# Patient Record
Sex: Female | Born: 1990 | Race: Black or African American | Hispanic: No | Marital: Single | Smoking: Never smoker
Health system: Southern US, Community
[De-identification: ages and names within clinical notes are randomized; demographics above are authoritative.]

## PROBLEM LIST (undated history)

## (undated) HISTORY — PX: APPENDECTOMY: SHX54

## (undated) HISTORY — PX: WISDOM TOOTH EXTRACTION: SHX21

---

## 2014-09-02 ENCOUNTER — Emergency Department (HOSPITAL_COMMUNITY): Payer: Self-pay

## 2014-09-02 ENCOUNTER — Encounter (HOSPITAL_COMMUNITY): Payer: Self-pay

## 2014-09-02 ENCOUNTER — Emergency Department (HOSPITAL_COMMUNITY)
Admission: EM | Admit: 2014-09-02 | Discharge: 2014-09-02 | Disposition: A | Discharge status: Home / Self Care | Payer: Self-pay | Attending: Emergency Medicine | Admitting: Emergency Medicine

## 2014-09-02 DIAGNOSIS — R102 Pelvic and perineal pain: Secondary | ICD-10-CM

## 2014-09-02 DIAGNOSIS — Z9049 Acquired absence of other specified parts of digestive tract: Secondary | ICD-10-CM | POA: Insufficient documentation

## 2014-09-02 DIAGNOSIS — B9689 Other specified bacterial agents as the cause of diseases classified elsewhere: Secondary | ICD-10-CM

## 2014-09-02 DIAGNOSIS — Z3490 Encounter for supervision of normal pregnancy, unspecified, unspecified trimester: Secondary | ICD-10-CM

## 2014-09-02 DIAGNOSIS — O23591 Infection of other part of genital tract in pregnancy, first trimester: Secondary | ICD-10-CM | POA: Insufficient documentation

## 2014-09-02 DIAGNOSIS — N760 Acute vaginitis: Secondary | ICD-10-CM

## 2014-09-02 DIAGNOSIS — Z3A11 11 weeks gestation of pregnancy: Secondary | ICD-10-CM | POA: Insufficient documentation

## 2014-09-02 DIAGNOSIS — O26899 Other specified pregnancy related conditions, unspecified trimester: Secondary | ICD-10-CM

## 2014-09-02 LAB — WET PREP, GENITAL
TRICH WET PREP: NONE SEEN
YEAST WET PREP: NONE SEEN

## 2014-09-02 LAB — URINALYSIS, ROUTINE W REFLEX MICROSCOPIC
BILIRUBIN URINE: NEGATIVE
Glucose, UA: NEGATIVE mg/dL
Hgb urine dipstick: NEGATIVE
Ketones, ur: NEGATIVE mg/dL
Leukocytes, UA: NEGATIVE
NITRITE: NEGATIVE
Protein, ur: NEGATIVE mg/dL
SPECIFIC GRAVITY, URINE: 1.007 (ref 1.005–1.030)
Urobilinogen, UA: 0.2 mg/dL (ref 0.0–1.0)
pH: 6.5 (ref 5.0–8.0)

## 2014-09-02 LAB — HCG, QUANTITATIVE, PREGNANCY: hCG, Beta Chain, Quant, S: 151032 m[IU]/mL — ABNORMAL HIGH (ref <5)

## 2014-09-02 LAB — POC URINE PREG, ED: Preg Test, Ur: POSITIVE — AB

## 2014-09-02 MED ORDER — PRENATAL COMPLETE 14-0.4 MG PO TABS: 1.0000 | ORAL_TABLET | Freq: Two times a day (BID) | ORAL | Status: DC

## 2014-09-02 MED ORDER — METRONIDAZOLE 500 MG PO TABS: 500.0000 mg | ORAL_TABLET | Freq: Two times a day (BID) | ORAL | Status: DC

## 2014-09-02 NOTE — ED Notes (Signed)
Ultrasound at bedside

## 2014-09-02 NOTE — ED Provider Notes (Signed)
CSN: [id number]     Arrival date & time 09/02/14  1458 History   First MD Initiated Contact with Patient 09/02/14 1516     Chief Complaint  Patient presents with  . Abdominal Pain     (Consider location/radiation/quality/duration/timing/severity/associated sxs/prior Treatment) HPI   PCP: No PCP Per Patient Blood pressure 131/68, pulse 100, temperature 97.9 F (36.6 C), temperature source Oral, resp. rate 16, last menstrual period 07/23/2014, SpO2 100 %.  Hannah Andrews is a 24 y.o.female with a significant PMH of appendectomy presents to the ER with complaints of vaginal discharge, lower abdominal discomfort and urinary frequency for 1 week. She describes the discharged as white. Denies any noticeable odor. She has not had any nausea,vomiting, diarrhea. Denies fevers, back pains, vaginal bleeding. She is sexually active but does not always use protection. She is 2 weeks late on her menstrual cycle. Her pain is suprapubic and described as cramping with sharp intermittent pains.   History reviewed. No pertinent past medical history. Past Surgical History  Procedure Laterality Date  . Appendectomy     History reviewed. No pertinent family history. History  Substance Use Topics  . Smoking status: Never Smoker   . Smokeless tobacco: Never Used  . Alcohol Use: No   OB History    No data available     Review of Systems  10 Systems reviewed and are negative for acute change except as noted in the HPI.    Allergies  Motrin  Home Medications   Prior to Admission medications   Medication Sig Start Date End Date Taking? Authorizing Provider  Chlorpheniramine-Phenylephrine (SINUS & ALLERGY PO) Take 1 tablet by mouth 2 (two) times daily as needed (sinuses.).   Yes Historical Provider, MD  Prenatal Vit-Fe Fumarate-FA (PRENATAL COMPLETE) 14-0.4 MG TABS Take 1 tablet by mouth 2 (two) times daily. 09/02/14   Tashena Ibach Neva Seat, PA-C   BP 97/46 mmHg  Pulse 73  Temp(Src) 98 F (36.7 C)  (Oral)  Resp 18  SpO2 100%  LMP 07/23/2014 Physical Exam  Constitutional: She appears well-developed and well-nourished. No distress.  HENT:  Head: Normocephalic and atraumatic.  Eyes: Pupils are equal, round, and reactive to light.  Neck: Normal range of motion. Neck supple.  Cardiovascular: Normal rate and regular rhythm.   Pulmonary/Chest: Effort normal.  Abdominal: Soft.  Genitourinary: Uterus is enlarged. Cervix exhibits no motion tenderness, no discharge and no friability. Right adnexum displays no mass, no tenderness and no fullness. Left adnexum displays no mass, no tenderness and no fullness.  Cervix is closed  Neurological: She is alert.  Skin: Skin is warm and dry.  Nursing note and vitals reviewed.   ED Course  Procedures (including critical care time) Labs Review Labs Reviewed  WET PREP, GENITAL - Abnormal; Notable for the following:    Clue Cells Wet Prep HPF POC RARE (*)    WBC, Wet Prep HPF POC RARE (*)    All other components within normal limits  HCG, QUANTITATIVE, PREGNANCY - Abnormal; Notable for the following:    hCG, Beta Chain, Quant, Kathie Rhodes [id number] (*)    All other components within normal limits  POC URINE PREG, ED - Abnormal; Notable for the following:    Preg Test, Ur POSITIVE (*)    All other components within normal limits  URINALYSIS, ROUTINE W REFLEX MICROSCOPIC  GC/CHLAMYDIA PROBE AMP ()    Imaging Review US Ob Comp Less 14 Wks  09/02/2014   CLINICAL DATA:  Early pregnancy. Pelvic pain. Estimated  gestational age by last menstrual period equals 7 weeks 2 days.  EXAM: OBSTETRIC <14 WK ULTRASOUND  TECHNIQUE: Transabdominal ultrasound was performed for evaluation of the gestation as well as the maternal uterus and adnexal regions.  COMPARISON:  None.  FINDINGS: Intrauterine gestational sac: Single  Yolk sac:  Present  Embryo:  Present  Cardiac Activity: Present  Heart Rate: 165 bpm  CRL:   45  mm   11 w 2 d                  US EDC: 03/22/2015   Maternal uterus/adnexae: Ovaries not visualized transabdominally. Uterus is normal. No free fluid.  IMPRESSION: 1. Single intrauterine gestation with embryo and normal cardiac activity.  2. Estimated gestational age by crown rump length equals 11 weeks 2 days.   Electronically Signed   By: Genevive BiStewart  Edmunds M.D.   On: 09/02/2014 19:26     EKG Interpretation None      MDM   Final diagnoses:  Pregnant    Patient with 11 week single IUP.  Urinalysis negative for infection. Wet prep- rare clue cells, will treat with flagyl.  I discussed the results with the patient regarding pregnancy and the need for prenatal vitamins and to follow-up with ob/gyn. She has not had any bleeding/spotting. Advised to take Tylenol for back pain and stomach cramps. GC cultures will be sent out.  23 y.o.Hannah Andrews's evaluation in the Emergency Department is complete. It has been determined that no acute conditions requiring further emergency intervention are present at this time. The patient/guardian have been advised of the diagnosis and plan. We have discussed signs and symptoms that warrant return to the ED, such as changes or worsening in symptoms.  Vital signs are stable at discharge. Filed Vitals:   09/02/14 1829  BP: 97/46  Pulse: 73  Temp: 98 F (36.7 C)  Resp: 18    Patient/guardian has voiced understanding and agreed to follow-up with the PCP or specialist.     Marlon Peliffany Dudley Cooley, PA-C 09/02/14 1935  Marlon Peliffany Brinly Maietta, PA-C 09/02/14 1937  Shon Batonourtney F Horton, MD 09/03/14 2005

## 2014-09-02 NOTE — Discharge Instructions (Signed)
First Trimester of Pregnancy °The first trimester of pregnancy is from week 1 until the end of week 12 (months 1 through 3). A week after a sperm fertilizes an egg, the egg will implant on the wall of the uterus. This embryo will begin to develop into a baby. Genes from you and your partner are forming the baby. The female genes determine whether the baby is a boy or a girl. At 6-8 weeks, the eyes and face are formed, and the heartbeat can be seen on ultrasound. At the end of 12 weeks, all the baby's organs are formed.  °Now that you are pregnant, you will want to do everything you can to have a healthy baby. Two of the most important things are to get good prenatal care and to follow your health care provider's instructions. Prenatal care is all the medical care you receive before the baby's birth. This care will help prevent, find, and treat any problems during the pregnancy and childbirth. °BODY CHANGES °Your body goes through many changes during pregnancy. The changes vary from woman to woman.  °· You may gain or lose a couple of pounds at first. °· You may feel sick to your stomach (nauseous) and throw up (vomit). If the vomiting is uncontrollable, call your health care provider. °· You may tire easily. °· You may develop headaches that can be relieved by medicines approved by your health care provider. °· You may urinate more often. Painful urination may mean you have a bladder infection. °· You may develop heartburn as a result of your pregnancy. °· You may develop constipation because certain hormones are causing the muscles that push waste through your intestines to slow down. °· You may develop hemorrhoids or swollen, bulging veins (varicose veins). °· Your breasts may begin to grow larger and become tender. Your nipples may stick out more, and the tissue that surrounds them (areola) may become darker. °· Your gums may bleed and may be sensitive to brushing and flossing. °· Dark spots or blotches (chloasma,  mask of pregnancy) may develop on your face. This will likely fade after the baby is born. °· Your menstrual periods will stop. °· You may have a loss of appetite. °· You may develop cravings for certain kinds of food. °· You may have changes in your emotions from day to day, such as being excited to be pregnant or being concerned that something may go wrong with the pregnancy and baby. °· You may have more vivid and strange dreams. °· You may have changes in your hair. These can include thickening of your hair, rapid growth, and changes in texture. Some women also have hair loss during or after pregnancy, or hair that feels dry or thin. Your hair will most likely return to normal after your baby is born. °WHAT TO EXPECT AT YOUR PRENATAL VISITS °During a routine prenatal visit: °· You will be weighed to make sure you and the baby are growing normally. °· Your blood pressure will be taken. °· Your abdomen will be measured to track your baby's growth. °· The fetal heartbeat will be listened to starting around week 10 or 12 of your pregnancy. °· Test results from any previous visits will be discussed. °Your health care provider may ask you: °· How you are feeling. °· If you are feeling the baby move. °· If you have had any abnormal symptoms, such as leaking fluid, bleeding, severe headaches, or abdominal cramping. °· If you have any questions. °Other tests   that may be performed during your first trimester include: °· Blood tests to find your blood type and to check for the presence of any previous infections. They will also be used to check for low iron levels (anemia) and Rh antibodies. Later in the pregnancy, blood tests for diabetes will be done along with other tests if problems develop. °· Urine tests to check for infections, diabetes, or protein in the urine. °· An ultrasound to confirm the proper growth and development of the baby. °· An amniocentesis to check for possible genetic problems. °· Fetal screens for  spina bifida and Down syndrome. °· You may need other tests to make sure you and the baby are doing well. °HOME CARE INSTRUCTIONS  °Medicines °· Follow your health care provider's instructions regarding medicine use. Specific medicines may be either safe or unsafe to take during pregnancy. °· Take your prenatal vitamins as directed. °· If you develop constipation, try taking a stool softener if your health care provider approves. °Diet °· Eat regular, well-balanced meals. Choose a variety of foods, such as meat or vegetable-based protein, fish, milk and low-fat dairy products, vegetables, fruits, and whole grain breads and cereals. Your health care provider will help you determine the amount of weight gain that is right for you. °· Avoid raw meat and uncooked cheese. These carry germs that can cause birth defects in the baby. °· Eating four or five small meals rather than three large meals a day may help relieve nausea and vomiting. If you start to feel nauseous, eating a few soda crackers can be helpful. Drinking liquids between meals instead of during meals also seems to help nausea and vomiting. °· If you develop constipation, eat more high-fiber foods, such as fresh vegetables or fruit and whole grains. Drink enough fluids to keep your urine clear or pale yellow. °Activity and Exercise °· Exercise only as directed by your health care provider. Exercising will help you: °¨ Control your weight. °¨ Stay in shape. °¨ Be prepared for labor and delivery. °· Experiencing pain or cramping in the lower abdomen or low back is a good sign that you should stop exercising. Check with your health care provider before continuing normal exercises. °· Try to avoid standing for long periods of time. Move your legs often if you must stand in one place for a long time. °· Avoid heavy lifting. °· Wear low-heeled shoes, and practice good posture. °· You may continue to have sex unless your health care provider directs you  otherwise. °Relief of Pain or Discomfort °· Wear a good support bra for breast tenderness.   °· Take warm sitz baths to soothe any pain or discomfort caused by hemorrhoids. Use hemorrhoid cream if your health care provider approves.   °· Rest with your legs elevated if you have leg cramps or low back pain. °· If you develop varicose veins in your legs, wear support hose. Elevate your feet for 15 minutes, 3-4 times a day. Limit salt in your diet. °Prenatal Care °· Schedule your prenatal visits by the twelfth week of pregnancy. They are usually scheduled monthly at first, then more often in the last 2 months before delivery. °· Write down your questions. Take them to your prenatal visits. °· Keep all your prenatal visits as directed by your health care provider. °Safety °· Wear your seat belt at all times when driving. °· Make a list of emergency phone numbers, including numbers for family, friends, the hospital, and police and fire departments. °General Tips °·   Ask your health care provider for a referral to a local prenatal education class. Begin classes no later than at the beginning of month 6 of your pregnancy.  Ask for help if you have counseling or nutritional needs during pregnancy. Your health care provider can offer advice or refer you to specialists for help with various needs.  Do not use hot tubs, steam rooms, or saunas.  Do not douche or use tampons or scented sanitary pads.  Do not cross your legs for long periods of time.  Avoid cat litter boxes and soil used by cats. These carry germs that can cause birth defects in the baby and possibly loss of the fetus by miscarriage or stillbirth.  Avoid all smoking, herbs, alcohol, and medicines not prescribed by your health care provider. Chemicals in these affect the formation and growth of the baby.  Schedule a dentist appointment. At home, brush your teeth with a soft toothbrush and be gentle when you floss. SEEK MEDICAL CARE IF:   You have  dizziness.  You have mild pelvic cramps, pelvic pressure, or nagging pain in the abdominal area.  You have persistent nausea, vomiting, or diarrhea.  You have a bad smelling vaginal discharge.  You have pain with urination.  You notice increased swelling in your face, hands, legs, or ankles. SEEK IMMEDIATE MEDICAL CARE IF:   You have a fever.  You are leaking fluid from your vagina.  You have spotting or bleeding from your vagina.  You have severe abdominal cramping or pain.  You have rapid weight gain or loss.  You vomit blood or material that looks like coffee grounds.  You are exposed to [place] measles and have never had them.  You are exposed to fifth disease or chickenpox.  You develop a severe headache.  You have shortness of breath.  You have any kind of trauma, such as from a fall or a car accident. Document Released: 04/17/2001 Document Revised: 09/07/2013 Document Reviewed: 03/03/2013 [facility] Patient Information 2015 [place], [place]. This information is not intended to replace advice given to you by your health care provider. Make sure you discuss any questions you have with your health care provider.  Prenatal Care  WHAT IS PRENATAL CARE?  Prenatal care means health care during your pregnancy, before your baby is born. It is very important to take care of yourself and your baby during your pregnancy by:   Getting early prenatal care. If you know you are pregnant, or think you might be pregnant, call your health care provider as soon as possible. Schedule a visit for a prenatal exam.  Getting regular prenatal care. Follow your health care provider's schedule for blood and other necessary tests. Do not miss appointments.  Doing everything you can to keep yourself and your baby healthy during your pregnancy.  Getting complete care. Prenatal care should include evaluation of the medical, dietary, educational, psychological, and social needs of you and your  significant other. The medical and genetic history of your family and the family of your baby's father should be discussed with your health care provider.  Discussing with your health care provider:  Prescription, over-the-counter, and herbal medicines that you take.  Any history of substance abuse, alcohol use, smoking, and illegal drug use.  Any history of domestic abuse and violence.  Immunizations you have received.  Your nutrition and diet.  The amount of exercise you do.  Any environmental and occupational hazards to which you are exposed.  History of sexually transmitted  infections for both you and your partner.  Previous pregnancies you have had. WHY IS PRENATAL CARE SO IMPORTANT?  By regularly seeing your health care provider, you help ensure that problems can be identified early so that they can be treated as soon as possible. Other problems might be prevented. Many studies have shown that early and regular prenatal care is important for the health of mothers and their babies.  HOW CAN I TAKE CARE OF MYSELF WHILE I AM PREGNANT?  Here are ways to take care of yourself and your baby:   Start or continue taking your multivitamin with 400 micrograms (mcg) of folic acid every day.  Get early and regular prenatal care. It is very important to see a health care provider during your pregnancy. Your health care provider will check at each visit to make sure that you and your baby are healthy. If there are any problems, action can be taken right away to help you and your baby.  Eat a healthy diet that includes:  Fruits.  Vegetables.  Foods low in saturated fat.  Whole grains.  Calcium-rich foods, such as milk, yogurt, and hard cheeses.  Drink 6-8 glasses of liquids a day.  Unless your health care provider tells you not to, try to be physically active for 30 minutes, most days of the week. If you are pressed for time, you can get your activity in through 10-minute segments,  three times a day.  Do not smoke, drink alcohol, or use drugs. These can cause long-term damage to your baby. Talk with your health care provider about steps to take to stop smoking. Talk with a member of your faith community, a counselor, a trusted friend, or your health care provider if you are concerned about your alcohol or drug use.  Ask your health care provider before taking any medicine, even over-the-counter medicines. Some medicines are not safe to take during pregnancy.  Get plenty of rest and sleep.  Avoid hot tubs and saunas during pregnancy.  Do not have X-rays taken unless absolutely necessary and with the recommendation of your health care provider. A lead shield can be placed on your abdomen to protect your baby when X-rays are taken in other parts of your body.  Do not empty the cat litter when you are pregnant. It may contain a parasite that causes an infection called toxoplasmosis, which can cause birth defects. Also, use gloves when working in garden areas used by cats.  Do not eat uncooked or undercooked meats or fish.  Do not eat soft, mold-ripened cheeses (Brie, Camembert, and chevre) or soft, blue-veined cheese (Danish blue and Roquefort).  Stay away from toxic chemicals like:  Insecticides.  Solvents (some cleaners or paint thinners).  Lead.  Mercury.  Sexual intercourse may continue until the end of the pregnancy, unless you have a medical problem or there is a problem with the pregnancy and your health care provider tells you not to.  Do not wear high-heel shoes, especially during the second half of the pregnancy. You can lose your balance and fall.  Do not take long trips, unless absolutely necessary. Be sure to see your health care provider before going on the trip.  Do not sit in one position for more than 2 hours when on a trip.  Take a copy of your medical records when going on a trip. Know where a hospital is located in the city you are visiting,  in case of an emergency.  Most dangerous household  products will have pregnancy warnings on their labels. Ask your health care provider about products if you are unsure.  Limit or eliminate your caffeine intake from coffee, tea, sodas, medicines, and chocolate.  Many women continue working through pregnancy. Staying active might help you stay healthier. If you have a question about the safety or the hours you work at your particular job, talk with your health care provider.  Get informed:  Read books.  Watch videos.  Go to childbirth classes for you and your significant other.  Talk with experienced moms.  Ask your health care provider about childbirth education classes for you and your partner. Classes can help you and your partner prepare for the birth of your baby.  Ask about a baby doctor (pediatrician) and methods and pain medicine for labor, delivery, and possible cesarean delivery. HOW OFTEN SHOULD I SEE MY HEALTH CARE PROVIDER DURING PREGNANCY?  Your health care provider will give you a schedule for your prenatal visits. You will have visits more often as you get closer to the end of your pregnancy. An average pregnancy lasts about 40 weeks.  A typical schedule includes visiting your health care provider:   About once each month during your first 6 months of pregnancy.  Every 2 weeks during the next 2 months.  Weekly in the last month, until the delivery date. Your health care provider will probably want to see you more often if:  You are older than 35 years.  Your pregnancy is high risk because you have certain health problems or problems with the pregnancy, such as:  Diabetes.  High blood pressure.  The baby is not growing on schedule, according to the dates of the pregnancy. Your health care provider will do special tests to make sure you and your baby are not having any serious problems. WHAT HAPPENS DURING PRENATAL VISITS?   At your first prenatal visit, your  health care provider will do a physical exam and talk to you about your health history and the health history of your partner and your family. Your health care provider will be able to tell you what date to expect your baby to be born on.  Your first physical exam will include checks of your blood pressure, measurements of your height and weight, and an exam of your pelvic organs. Your health care provider will do a Pap test if you have not had one recently and will do cultures of your cervix to make sure there is no infection.  At each prenatal visit, there will be tests of your blood, urine, blood pressure, weight, and the progress of the baby will be checked.  At your later prenatal visits, your health care provider will check how you are doing and how your baby is developing. You may have a number of tests done as your pregnancy progresses.  Ultrasound exams are often used to check on your baby's growth and health.  You may have more urine and blood tests, as well as special tests, if needed. These may include amniocentesis to examine fluid in the pregnancy sac, stress tests to check how the baby responds to contractions, or a biophysical profile to measure your baby's well-being. Your health care provider will explain the tests and why they are necessary.  You should be tested for high blood sugar (gestational diabetes) between the 24th and 28th weeks of your pregnancy.  You should discuss with your health care provider your plans to breastfeed or bottle-feed your baby.  Each  visit is also a chance for you to learn about staying healthy during pregnancy and to ask questions. Document Released: 04/26/2003 Document Revised: 04/28/2013 Document Reviewed: 07/08/2013 [facility] Patient Information 2015 [place], [place]. This information is not intended to replace advice given to you by your health care provider. Make sure you discuss any questions you have with your health care provider.

## 2014-09-02 NOTE — ED Notes (Signed)
Patient c/o lower abdominal pain x 1 week. Patient states she has been having a small amount of white vaginal discharge. Patient also reports urinary frequency.

## 2014-09-03 LAB — GC/CHLAMYDIA PROBE AMP (~~LOC~~) NOT AT ARMC
CHLAMYDIA, DNA PROBE: NEGATIVE
Neisseria Gonorrhea: NEGATIVE

## 2016-11-28 ENCOUNTER — Encounter: Payer: Self-pay | Admitting: Physician Assistant

## 2016-11-28 ENCOUNTER — Ambulatory Visit

## 2016-11-28 ENCOUNTER — Ambulatory Visit (INDEPENDENT_AMBULATORY_CARE_PROVIDER_SITE_OTHER): Payer: Self-pay | Admitting: Physician Assistant

## 2016-11-28 VITALS — BP 100/57 | HR 77 | Temp 98.4°F | Resp 16 | Ht 61.25 in | Wt 157.8 lb

## 2016-11-28 DIAGNOSIS — S90111A Contusion of right great toe without damage to nail, initial encounter: Secondary | ICD-10-CM

## 2016-11-28 DIAGNOSIS — M79674 Pain in right toe(s): Secondary | ICD-10-CM

## 2016-11-28 NOTE — Progress Notes (Signed)
[facility] [address], [place] [phone]  Date:  11/28/2016   Name:  Hannah Andrews   DOB:  08/03/1990   MRN:  [id number]  PCP:  Patient, No Pcp Per    History of Present Illness:  Hannah Andrews is a 26 y.o. female patient who presents to PCP with  Chief Complaint  Patient presents with  . Foot Injury    RIGHT - WORKER'S COMP 11/28/2016     Patient notes right toe pain that started today after a drawer like box slipped out and fell on her toe.  She had no bleeding but has had significant pain at her left toe. She has not done anything therapeutic for the foot.  She denies numbness or tingling.   There are no active problems to display for this patient.   No past medical history on file.  Past Surgical History:  Procedure Laterality Date  . APPENDECTOMY      Social History  Substance Use Topics  . Smoking status: Never Smoker  . Smokeless tobacco: Never Used  . Alcohol use No    No family history on file.  Allergies  Allergen Reactions  . Motrin [Ibuprofen] Swelling    Liquid--make eyes swell.      Medication list has been reviewed and updated.  Current Outpatient Prescriptions on File Prior to Visit  Medication Sig Dispense Refill  . metroNIDAZOLE (FLAGYL) 500 MG tablet Take 1 tablet (500 mg total) by mouth 2 (two) times daily. 14 tablet 0  . Prenatal Vit-Fe Fumarate-FA (PRENATAL COMPLETE) 14-0.4 MG TABS Take 1 tablet by mouth 2 (two) times daily. (Patient not taking: Reported on 11/28/2016) 60 each 2   No current facility-administered medications on file prior to visit.     ROS ROS otherwise unremarkable unless listed above.  Physical Examination: BP (!) 100/57 (BP Location: Right Arm, Patient Position: Sitting, Cuff Size: Normal)   Pulse 77   Temp 98.4 F (36.9 C) (Oral)   Resp 16   Ht 5' 1.25" (1.556 m)   Wt 157 lb 12.8 oz (71.6 kg)   LMP 11/08/2016   SpO2 100%   BMI 29.57 kg/m  Ideal Body Weight: Weight in  (lb) to have BMI = 25: 133.1  Physical Exam  Constitutional: She is oriented to person, place, and time. She appears well-developed and well-nourished. No distress.  HENT:  Head: Normocephalic and atraumatic.  Right Ear: External ear normal.  Left Ear: External ear normal.  Eyes: Pupils are equal, round, and reactive to light. Conjunctivae and EOM are normal.  Cardiovascular: Normal rate.   Pulmonary/Chest: Effort normal. No respiratory distress.  Musculoskeletal:  Very slight erythema at the dip of the 1st right toe.  Tenderness at this location.  Normal ROM.  Neurological: She is alert and oriented to person, place, and time.  Skin: She is not diaphoretic.  Psychiatric: She has a normal mood and affect. Her behavior is normal.     Assessment and Plan: Hannah Andrews is a 26 y.o. female who is here today for cc of right toe pain. Advised ice and tylenol. Likely contusion. Contusion of right great toe without damage to nail, initial encounter  Pain of right great toe - Plan: DG Toe Great Right  Trena PlattStephanie English, PA-C [facility] 8/2/20186:52 AM

## 2016-11-28 NOTE — Patient Instructions (Addendum)
  Please ice the toe three times per day for 15 minutes.   You can take tylenol for the pain.    Contusion A contusion is a deep bruise. Contusions happen when an injury causes bleeding under the skin. Symptoms of bruising include pain, swelling, and discolored skin. The skin may turn blue, purple, or yellow. Follow these instructions at home:  Rest the injured area.  If told, put ice on the injured area. ? Put ice in a plastic bag. ? Place a towel between your skin and the bag. ? Leave the ice on for 20 minutes, 2-3 times per day.  If told, put light pressure (compression) on the injured area using an elastic bandage. Make sure the bandage is not too tight. Remove it and put it back on as told by your doctor.  If possible, raise (elevate) the injured area above the level of your heart while you are sitting or lying down.  Take over-the-counter and prescription medicines only as told by your doctor. Contact a doctor if:  Your symptoms do not get better after several days of treatment.  Your symptoms get worse.  You have trouble moving the injured area. Get help right away if:  You have very bad pain.  You have a loss of feeling (numbness) in a hand or foot.  Your hand or foot turns pale or cold. This information is not intended to replace advice given to you by your health care provider. Make sure you discuss any questions you have with your health care provider. Document Released: 10/10/2007 Document Revised: 09/29/2015 Document Reviewed: 09/08/2014 [employer] Interactive Patient Education  2018 [employer]    IF you received an x-ray today, you will receive an invoice from [facility]. Please contact [facility] at [phone] with questions or concerns regarding your invoice.   IF you received labwork today, you will receive an invoice from [facility]. Please contact [facility] at [phone] with questions or concerns regarding your invoice.   Our  billing staff will not be able to assist you with questions regarding bills from these companies.  You will be contacted with the lab results as soon as they are available. The fastest way to get your results is to activate your My Chart account. Instructions are located on the last page of this paperwork. If you have not heard from us regarding the results in 2 weeks, please contact this office.

## 2017-08-07 ENCOUNTER — Encounter: Payer: Self-pay | Admitting: Physician Assistant

## 2017-10-16 ENCOUNTER — Ambulatory Visit (INDEPENDENT_AMBULATORY_CARE_PROVIDER_SITE_OTHER): Payer: Federal, State, Local not specified - PPO | Admitting: Family Medicine

## 2017-10-16 ENCOUNTER — Encounter: Payer: Self-pay | Admitting: Family Medicine

## 2017-10-16 ENCOUNTER — Other Ambulatory Visit: Payer: Self-pay

## 2017-10-16 VITALS — BP 106/68 | HR 58 | Temp 98.8°F | Resp 18 | Ht 62.25 in | Wt 164.2 lb

## 2017-10-16 DIAGNOSIS — Z124 Encounter for screening for malignant neoplasm of cervix: Secondary | ICD-10-CM

## 2017-10-16 DIAGNOSIS — R140 Abdominal distension (gaseous): Secondary | ICD-10-CM | POA: Diagnosis not present

## 2017-10-16 DIAGNOSIS — Z0000 Encounter for general adult medical examination without abnormal findings: Secondary | ICD-10-CM

## 2017-10-16 DIAGNOSIS — Z113 Encounter for screening for infections with a predominantly sexual mode of transmission: Secondary | ICD-10-CM

## 2017-10-16 DIAGNOSIS — R87610 Atypical squamous cells of undetermined significance on cytologic smear of cervix (ASC-US): Secondary | ICD-10-CM | POA: Diagnosis not present

## 2017-10-16 NOTE — Progress Notes (Signed)
Chief Complaint  Patient presents with  . Annual Exam    with pap    Subjective:  Hannah Andrews is a 27 y.o. female here for a health maintenance visit.  Patient is established pt  There are no active problems to display for this patient.   No past medical history on file.  Past Surgical History:  Procedure Laterality Date  . APPENDECTOMY       Outpatient Medications Prior to Visit  Medication Sig Dispense Refill  . metroNIDAZOLE (FLAGYL) 500 MG tablet Take 1 tablet (500 mg total) by mouth 2 (two) times daily. (Patient not taking: Reported on 10/16/2017) 14 tablet 0  . Prenatal Vit-Fe Fumarate-FA (PRENATAL COMPLETE) 14-0.4 MG TABS Take 1 tablet by mouth 2 (two) times daily. (Patient not taking: Reported on 11/28/2016) 60 each 2   No facility-administered medications prior to visit.     Allergies  Allergen Reactions  . Motrin [Ibuprofen] Swelling    Liquid--make eyes swell.       Family History  Problem Relation Age of Onset  . Diabetes Maternal Grandmother      Health Habits: Dental Exam: up to date Eye Exam: up to date Exercise: 5 times/week on average Current exercise activities: walking/running Diet: balanced  Social History   Socioeconomic History  . Marital status: Single    Spouse name: Not on file  . Number of children: Not on file  . Years of education: Not on file  . Highest education level: Not on file  Occupational History  . Not on file  Social Needs  . Financial resource strain: Not on file  . Food insecurity:    Worry: Not on file    Inability: Not on file  . Transportation needs:    Medical: Not on file    Non-medical: Not on file  Tobacco Use  . Smoking status: Never Smoker  . Smokeless tobacco: Never Used  Substance and Sexual Activity  . Alcohol use: No  . Drug use: No  . Sexual activity: Yes    Birth control/protection: Condom  Lifestyle  . Physical activity:    Days per week: Not on file    Minutes per session: Not on  file  . Stress: Not on file  Relationships  . Social connections:    Talks on phone: Not on file    Gets together: Not on file    Attends religious service: Not on file    Active member of club or organization: Not on file    Attends meetings of clubs or organizations: Not on file    Relationship status: Not on file  . Intimate partner violence:    Fear of current or ex partner: Not on file    Emotionally abused: Not on file    Physically abused: Not on file    Forced sexual activity: Not on file  Other Topics Concern  . Not on file  Social History Narrative  . Not on file   Social History   Substance and Sexual Activity  Alcohol Use No   Social History   Tobacco Use  Smoking Status Never Smoker  Smokeless Tobacco Never Used   Social History   Substance and Sexual Activity  Drug Use No    GYN: Sexual Health Menstrual status: regular menses LMP: Patient's last menstrual period was 10/14/2017. Last pap smear: see HM section History of abnormal pap smears:    Health Maintenance: See under health Maintenance activity for review of completion dates as well.  There is no immunization history on file for this patient.    Depression Screen-PHQ2/9 Depression screen [facility] 2/9 10/16/2017 11/28/2016  Decreased Interest 0 0  Down, Depressed, Hopeless 0 0  PHQ - 2 Score 0 0       Depression Severity and Treatment Recommendations:  0-4= None  5-9= Mild / Treatment: Support, educate to call if worse; return in one month  10-14= Moderate / Treatment: Support, watchful waiting; Antidepressant or Psycotherapy  15-19= Moderately severe / Treatment: Antidepressant OR Psychotherapy  >= 20 = Major depression, severe / Antidepressant AND Psychotherapy    Review of Systems   Review of Systems  Constitutional: Negative for chills, fever and weight loss.  HENT: Negative for ear pain, hearing loss and tinnitus.   Eyes: Negative for blurred vision and double vision.   Respiratory: Negative for cough, shortness of breath and wheezing.   Cardiovascular: Negative for chest pain and palpitations.  Gastrointestinal: Negative for abdominal pain, nausea and vomiting.  Genitourinary: Negative for dysuria, frequency, hematuria and urgency.  Musculoskeletal: Negative for back pain, myalgias and neck pain.  Skin: Negative for itching and rash.  Neurological: Negative for dizziness, tingling and headaches.  Psychiatric/Behavioral: Negative for depression. The patient is not nervous/anxious and does not have insomnia.     See HPI for ROS as well.    Objective:   Vitals:   10/16/17 1412  BP: 106/68  Pulse: (!) 58  Resp: 18  Temp: 98.8 F (37.1 C)  TempSrc: Oral  SpO2: 99%  Weight: 164 lb 3.2 oz (74.5 kg)  Height: 5' 2.25" (1.581 m)    Body mass index is 29.79 kg/m.  Physical Exam  BP 106/68 (BP Location: Left Arm, Patient Position: Sitting, Cuff Size: Normal)   Pulse (!) 58   Temp 98.8 F (37.1 C) (Oral)   Resp 18   Ht 5' 2.25" (1.581 m)   Wt 164 lb 3.2 oz (74.5 kg)   LMP 10/14/2017   SpO2 99%   BMI 29.79 kg/m   General Appearance:    Alert, cooperative, no distress, appears stated age  Head:    Normocephalic, without obvious abnormality, atraumatic  Eyes:    PERRL, conjunctiva/corneas clear, EOM's intact, fundi    benign, both eyes  Ears:    Normal TM's and external ear canals, both ears  Nose:   Nares normal, septum midline, mucosa normal, no drainage    or sinus tenderness  Throat:   Lips, mucosa, and tongue normal; teeth and gums normal  Neck:   Supple, symmetrical, trachea midline, no adenopathy;    thyroid:  no enlargement/tenderness/nodules; no carotid   bruit or JVD  Back:     Symmetric, no curvature, ROM normal, no CVA tenderness  Lungs:     Clear to auscultation bilaterally, respirations unlabored  Chest Wall:    No tenderness or deformity   Heart:    Regular rate and rhythm, S1 and S2 normal, no murmur, rub   or gallop   Breast Exam:    No tenderness, masses, or nipple abnormality  Abdomen:     Soft, non-tender, bowel sounds active all four quadrants,    no masses, no organomegaly  Genitalia:    Vaginal exam- Chaperone Present Labia normal bilaterally without skin lesions Urethral meatus normal appearing without erythema Vagina without discharge No CMT, ovaries small and not palpable Uterus midline, non-tender Pap smear performed   Extremities:   Extremities normal, atraumatic, no cyanosis or edema  Pulses:   2+ and  symmetric all extremities  Skin:   Skin color, texture, turgor normal, no rashes or lesions  Lymph nodes:   Cervical, supraclavicular, and axillary nodes normal  Neurologic:   CNII-XII intact, normal strength, sensation and reflexes    throughout       Assessment/Plan:   Patient was seen for a health maintenance exam.  Counseled the patient on health maintenance issues. Reviewed her health mainteance schedule and ordered appropriate tests (see orders.) Counseled on regular exercise and weight management. Recommend regular eye exams and dental cleaning.   The following issues were addressed today for health maintenance:   Avi was seen today for annual exam.  Diagnoses and all orders for this visit:  Encounter for health maintenance examination in adult Women's Health Maintenance Plan Advised monthly breast exam  Advised dental exam every six months Discussed stress management Discussed pap smear screening guidelines    Screen for STD (sexually transmitted disease)- verbal consent given for std screening -     HIV antibody -     Hepatitis B surface antigen -     RPR  Encounter for Papanicolaou smear for cervical cancer screening -     Pap IG, CT/NG w/ reflex HPV when ASC-U   Discussed cervical cancer screening  As well as screening for HPV Discussed risk factors for cervical cancer  And hpv vaccination records reviewed   Bloating Discuss probiotic and  increased hydration   No follow-ups on file.    Body mass index is 29.79 kg/m.:  Discussed the patient's BMI with patient. The BMI body mass index is 29.79 kg/m.     No future appointments.  Patient Instructions   Bloating Take a probiotic such as align or culturelle  Then for constipation use metamucil  Green tea for caffeine and help with water retention     IF you received an x-ray today, you will receive an invoice from [facility]. Please contact [facility] at [phone] with questions or concerns regarding your invoice.   IF you received labwork today, you will receive an invoice from [facility]. Please contact [facility] at [phone] with questions or concerns regarding your invoice.   Our billing staff will not be able to assist you with questions regarding bills from these companies.  You will be contacted with the lab results as soon as they are available. The fastest way to get your results is to activate your My Chart account. Instructions are located on the last page of this paperwork. If you have not heard from [place] regarding the results in 2 weeks, please contact this office.    Health Maintenance, Female Adopting a healthy lifestyle and getting preventive care can go a long way to promote health and wellness. Talk with your health care provider about what schedule of regular examinations is right for you. This is a good chance for you to check in with your provider about disease prevention and staying healthy. In between checkups, there are plenty of things you can do on your own. Experts have done a lot of research about which lifestyle changes and preventive measures are most likely to keep you healthy. Ask your health care provider for more information. Weight and diet Eat a healthy diet  Be sure to include plenty of vegetables, fruits, low-fat dairy products, and lean protein.  Do not eat a lot of foods high in solid fats, added sugars,  or salt.  Get regular exercise. This is one of the most important things you can do for  your health. ? Most adults should exercise for at least 150 minutes each week. The exercise should increase your heart rate and make you sweat (moderate-intensity exercise). ? Most adults should also do strengthening exercises at least twice a week. This is in addition to the moderate-intensity exercise.  Maintain a healthy weight  Body mass index (BMI) is a measurement that can be used to identify possible weight problems. It estimates body fat based on height and weight. Your health care provider can help determine your BMI and help you achieve or maintain a healthy weight.  For females 39 years of age and older: ? A BMI below 18.5 is considered underweight. ? A BMI of 18.5 to 24.9 is normal. ? A BMI of 25 to 29.9 is considered overweight. ? A BMI of 30 and above is considered obese.  Watch levels of cholesterol and blood lipids  You should start having your blood tested for lipids and cholesterol at 26 years of age, then have this test every 5 years.  You may need to have your cholesterol levels checked more often if: ? Your lipid or cholesterol levels are high. ? You are older than 26 years of age. ? You are at high risk for heart disease.  Cancer screening Lung Cancer  Lung cancer screening is recommended for adults 69-29 years old who are at high risk for lung cancer because of a history of smoking.  A yearly low-dose CT scan of the lungs is recommended for people who: ? Currently smoke. ? Have quit within the past 15 years. ? Have at least a 30-pack-year history of smoking. A pack year is smoking an average of one pack of cigarettes a day for 1 year.  Yearly screening should continue until it has been 15 years since you quit.  Yearly screening should stop if you develop a health problem that would prevent you from having lung cancer treatment.  Breast Cancer  Practice breast  self-awareness. This means understanding how your breasts normally appear and feel.  It also means doing regular breast self-exams. Let your health care provider know about any changes, no matter how small.  If you are in your 20s or 30s, you should have a clinical breast exam (CBE) by a health care provider every 1-3 years as part of a regular health exam.  If you are 67 or older, have a CBE every year. Also consider having a breast X-ray (mammogram) every year.  If you have a family history of breast cancer, talk to your health care provider about genetic screening.  If you are at high risk for breast cancer, talk to your health care provider about having an MRI and a mammogram every year.  Breast cancer gene (BRCA) assessment is recommended for women who have family members with BRCA-related cancers. BRCA-related cancers include: ? Breast. ? Ovarian. ? Tubal. ? Peritoneal cancers.  Results of the assessment will determine the need for genetic counseling and BRCA1 and BRCA2 testing.  Cervical Cancer Your health care provider may recommend that you be screened regularly for cancer of the pelvic organs (ovaries, uterus, and vagina). This screening involves a pelvic examination, including checking for microscopic changes to the surface of your cervix (Pap test). You may be encouraged to have this screening done every 3 years, beginning at age 65.  For women ages 59-65, health care providers may recommend pelvic exams and Pap testing every 3 years, or they may recommend the Pap and pelvic exam, combined with testing  for human papilloma virus (HPV), every 5 years. Some types of HPV increase your risk of cervical cancer. Testing for HPV may also be done on women of any age with unclear Pap test results.  Other health care providers may not recommend any screening for nonpregnant women who are considered low risk for pelvic cancer and who do not have symptoms. Ask your health care provider if a  screening pelvic exam is right for you.  If you have had past treatment for cervical cancer or a condition that could lead to cancer, you need Pap tests and screening for cancer for at least 20 years after your treatment. If Pap tests have been discontinued, your risk factors (such as having a new sexual partner) need to be reassessed to determine if screening should resume. Some women have medical problems that increase the chance of getting cervical cancer. In these cases, your health care provider may recommend more frequent screening and Pap tests.  Colorectal Cancer  This type of cancer can be detected and often prevented.  Routine colorectal cancer screening usually begins at 26 years of age and continues through 26 years of age.  Your health care provider may recommend screening at an earlier age if you have risk factors for colon cancer.  Your health care provider may also recommend using home test kits to check for hidden blood in the stool.  A small camera at the end of a tube can be used to examine your colon directly (sigmoidoscopy or colonoscopy). This is done to check for the earliest forms of colorectal cancer.  Routine screening usually begins at age 2.  Direct examination of the colon should be repeated every 5-10 years through 26 years of age. However, you may need to be screened more often if early forms of precancerous polyps or small growths are found.  Skin Cancer  Check your skin from head to toe regularly.  Tell your health care provider about any new moles or changes in moles, especially if there is a change in a mole's shape or color.  Also tell your health care provider if you have a mole that is larger than the size of a pencil eraser.  Always use sunscreen. Apply sunscreen liberally and repeatedly throughout the day.  Protect yourself by wearing long sleeves, pants, a wide-brimmed hat, and sunglasses whenever you are outside.  Heart disease, diabetes, and  high blood pressure  High blood pressure causes heart disease and increases the risk of stroke. High blood pressure is more likely to develop in: ? People who have blood pressure in the high end of the normal range (130-139/85-89 mm Hg). ? People who are overweight or obese. ? People who are African American.  If you are 83-22 years of age, have your blood pressure checked every 3-5 years. If you are 35 years of age or older, have your blood pressure checked every year. You should have your blood pressure measured twice-once when you are at a hospital or clinic, and once when you are not at a hospital or clinic. Record the average of the two measurements. To check your blood pressure when you are not at a hospital or clinic, you can use: ? An automated blood pressure machine at a pharmacy. ? A home blood pressure monitor.  If you are between 63 years and 1 years old, ask your health care provider if you should take aspirin to prevent strokes.  Have regular diabetes screenings. This involves taking a blood sample to  check your fasting blood sugar level. ? If you are at a normal weight and have a low risk for diabetes, have this test once every three years after 26 years of age. ? If you are overweight and have a high risk for diabetes, consider being tested at a younger age or more often. Preventing infection Hepatitis B  If you have a higher risk for hepatitis B, you should be screened for this virus. You are considered at high risk for hepatitis B if: ? You were born in a country where hepatitis B is common. Ask your health care provider which countries are considered high risk. ? Your parents were born in a high-risk country, and you have not been immunized against hepatitis B (hepatitis B vaccine). ? You have HIV or AIDS. ? You use needles to inject street drugs. ? You live with someone who has hepatitis B. ? You have had sex with someone who has hepatitis B. ? You get hemodialysis  treatment. ? You take certain medicines for conditions, including cancer, organ transplantation, and autoimmune conditions.  Hepatitis C  Blood testing is recommended for: ? Everyone born from [birth year] through 1964. ? Anyone with known risk factors for hepatitis C.  Sexually transmitted infections (STIs)  You should be screened for sexually transmitted infections (STIs) including gonorrhea and chlamydia if: ? You are sexually active and are younger than 26 years of age. ? You are older than 26 years of age and your health care provider tells you that you are at risk for this type of infection. ? Your sexual activity has changed since you were last screened and you are at an increased risk for chlamydia or gonorrhea. Ask your health care provider if you are at risk.  If you do not have HIV, but are at risk, it may be recommended that you take a prescription medicine daily to prevent HIV infection. This is called pre-exposure prophylaxis (PrEP). You are considered at risk if: ? You are sexually active and do not regularly use condoms or know the HIV status of your partner(s). ? You take drugs by injection. ? You are sexually active with a partner who has HIV.  Talk with your health care provider about whether you are at high risk of being infected with HIV. If you choose to begin PrEP, you should first be tested for HIV. You should then be tested every 3 months for as long as you are taking PrEP. Pregnancy  If you are premenopausal and you may become pregnant, ask your health care provider about preconception counseling.  If you may become pregnant, take 400 to 800 micrograms (mcg) of folic acid every day.  If you want to prevent pregnancy, talk to your health care provider about birth control (contraception). Osteoporosis and menopause  Osteoporosis is a disease in which the bones lose minerals and strength with aging. This can result in serious bone fractures. Your risk for osteoporosis  can be identified using a bone density scan.  If you are 47 years of age or older, or if you are at risk for osteoporosis and fractures, ask your health care provider if you should be screened.  Ask your health care provider whether you should take a calcium or vitamin D supplement to lower your risk for osteoporosis.  Menopause may have certain physical symptoms and risks.  Hormone replacement therapy may reduce some of these symptoms and risks. Talk to your health care provider about whether hormone replacement therapy is right for  you. Follow these instructions at home:  Schedule regular health, dental, and eye exams.  Stay current with your immunizations.  Do not use any tobacco products including cigarettes, chewing tobacco, or electronic cigarettes.  If you are pregnant, do not drink alcohol.  If you are breastfeeding, limit how much and how often you drink alcohol.  Limit alcohol intake to no more than 1 drink per day for nonpregnant women. One drink equals 12 ounces of beer, 5 ounces of wine, or 1 ounces of hard liquor.  Do not use street drugs.  Do not share needles.  Ask your health care provider for help if you need support or information about quitting drugs.  Tell your health care provider if you often feel depressed.  Tell your health care provider if you have ever been abused or do not feel safe at home. This information is not intended to replace advice given to you by your health care provider. Make sure you discuss any questions you have with your health care provider. Document Released: 11/06/2010 Document Revised: 09/29/2015 Document Reviewed: 01/25/2015 Elsevier Interactive Patient Education  [employer].

## 2017-10-16 NOTE — Patient Instructions (Addendum)
Bloating Take a probiotic such as align or culturelle  Then for constipation use metamucil  Green tea for caffeine and help with water retention     IF you received an x-ray today, you will receive an invoice from [facility]. Please contact [facility] at [phone] with questions or concerns regarding your invoice.   IF you received labwork today, you will receive an invoice from [facility]. Please contact [facility] at [phone] with questions or concerns regarding your invoice.   Our billing staff will not be able to assist you with questions regarding bills from these companies.  You will be contacted with the lab results as soon as they are available. The fastest way to get your results is to activate your My Chart account. Instructions are located on the last page of this paperwork. If you have not heard from [place] regarding the results in 2 weeks, please contact this office.    Health Maintenance, Female Adopting a healthy lifestyle and getting preventive care can go a long way to promote health and wellness. Talk with your health care provider about what schedule of regular examinations is right for you. This is a good chance for you to check in with your provider about disease prevention and staying healthy. In between checkups, there are plenty of things you can do on your own. Experts have done a lot of research about which lifestyle changes and preventive measures are most likely to keep you healthy. Ask your health care provider for more information. Weight and diet Eat a healthy diet  Be sure to include plenty of vegetables, fruits, low-fat dairy products, and lean protein.  Do not eat a lot of foods high in solid fats, added sugars, or salt.  Get regular exercise. This is one of the most important things you can do for your health. ? Most adults should exercise for at least 150 minutes each week. The exercise should increase your heart rate and make you  sweat (moderate-intensity exercise). ? Most adults should also do strengthening exercises at least twice a week. This is in addition to the moderate-intensity exercise.  Maintain a healthy weight  Body mass index (BMI) is a measurement that can be used to identify possible weight problems. It estimates body fat based on height and weight. Your health care provider can help determine your BMI and help you achieve or maintain a healthy weight.  For females 95 years of age and older: ? A BMI below 18.5 is considered underweight. ? A BMI of 18.5 to 24.9 is normal. ? A BMI of 25 to 29.9 is considered overweight. ? A BMI of 30 and above is considered obese.  Watch levels of cholesterol and blood lipids  You should start having your blood tested for lipids and cholesterol at 26 years of age, then have this test every 5 years.  You may need to have your cholesterol levels checked more often if: ? Your lipid or cholesterol levels are high. ? You are older than 26 years of age. ? You are at high risk for heart disease.  Cancer screening Lung Cancer  Lung cancer screening is recommended for adults 23-69 years old who are at high risk for lung cancer because of a history of smoking.  A yearly low-dose CT scan of the lungs is recommended for people who: ? Currently smoke. ? Have quit within the past 15 years. ? Have at least a 30-pack-year history of smoking. A pack year is smoking an average of  one pack of cigarettes a day for 1 year.  Yearly screening should continue until it has been 15 years since you quit.  Yearly screening should stop if you develop a health problem that would prevent you from having lung cancer treatment.  Breast Cancer  Practice breast self-awareness. This means understanding how your breasts normally appear and feel.  It also means doing regular breast self-exams. Let your health care provider know about any changes, no matter how small.  If you are in your 20s  or 30s, you should have a clinical breast exam (CBE) by a health care provider every 1-3 years as part of a regular health exam.  If you are 49 or older, have a CBE every year. Also consider having a breast X-ray (mammogram) every year.  If you have a family history of breast cancer, talk to your health care provider about genetic screening.  If you are at high risk for breast cancer, talk to your health care provider about having an MRI and a mammogram every year.  Breast cancer gene (BRCA) assessment is recommended for women who have family members with BRCA-related cancers. BRCA-related cancers include: ? Breast. ? Ovarian. ? Tubal. ? Peritoneal cancers.  Results of the assessment will determine the need for genetic counseling and BRCA1 and BRCA2 testing.  Cervical Cancer Your health care provider may recommend that you be screened regularly for cancer of the pelvic organs (ovaries, uterus, and vagina). This screening involves a pelvic examination, including checking for microscopic changes to the surface of your cervix (Pap test). You may be encouraged to have this screening done every 3 years, beginning at age 87.  For women ages 89-65, health care providers may recommend pelvic exams and Pap testing every 3 years, or they may recommend the Pap and pelvic exam, combined with testing for human papilloma virus (HPV), every 5 years. Some types of HPV increase your risk of cervical cancer. Testing for HPV may also be done on women of any age with unclear Pap test results.  Other health care providers may not recommend any screening for nonpregnant women who are considered low risk for pelvic cancer and who do not have symptoms. Ask your health care provider if a screening pelvic exam is right for you.  If you have had past treatment for cervical cancer or a condition that could lead to cancer, you need Pap tests and screening for cancer for at least 20 years after your treatment. If Pap tests  have been discontinued, your risk factors (such as having a new sexual partner) need to be reassessed to determine if screening should resume. Some women have medical problems that increase the chance of getting cervical cancer. In these cases, your health care provider may recommend more frequent screening and Pap tests.  Colorectal Cancer  This type of cancer can be detected and often prevented.  Routine colorectal cancer screening usually begins at 26 years of age and continues through 26 years of age.  Your health care provider may recommend screening at an earlier age if you have risk factors for colon cancer.  Your health care provider may also recommend using home test kits to check for hidden blood in the stool.  A small camera at the end of a tube can be used to examine your colon directly (sigmoidoscopy or colonoscopy). This is done to check for the earliest forms of colorectal cancer.  Routine screening usually begins at age 56.  Direct examination of the colon should  be repeated every 5-10 years through 26 years of age. However, you may need to be screened more often if early forms of precancerous polyps or small growths are found.  Skin Cancer  Check your skin from head to toe regularly.  Tell your health care provider about any new moles or changes in moles, especially if there is a change in a mole's shape or color.  Also tell your health care provider if you have a mole that is larger than the size of a pencil eraser.  Always use sunscreen. Apply sunscreen liberally and repeatedly throughout the day.  Protect yourself by wearing long sleeves, pants, a wide-brimmed hat, and sunglasses whenever you are outside.  Heart disease, diabetes, and high blood pressure  High blood pressure causes heart disease and increases the risk of stroke. High blood pressure is more likely to develop in: ? People who have blood pressure in the high end of the normal range (130-139/85-89 mm  Hg). ? People who are overweight or obese. ? People who are African American.  If you are 15-50 years of age, have your blood pressure checked every 3-5 years. If you are 73 years of age or older, have your blood pressure checked every year. You should have your blood pressure measured twice-once when you are at a hospital or clinic, and once when you are not at a hospital or clinic. Record the average of the two measurements. To check your blood pressure when you are not at a hospital or clinic, you can use: ? An automated blood pressure machine at a pharmacy. ? A home blood pressure monitor.  If you are between 83 years and 73 years old, ask your health care provider if you should take aspirin to prevent strokes.  Have regular diabetes screenings. This involves taking a blood sample to check your fasting blood sugar level. ? If you are at a normal weight and have a low risk for diabetes, have this test once every three years after 26 years of age. ? If you are overweight and have a high risk for diabetes, consider being tested at a younger age or more often. Preventing infection Hepatitis B  If you have a higher risk for hepatitis B, you should be screened for this virus. You are considered at high risk for hepatitis B if: ? You were born in a country where hepatitis B is common. Ask your health care provider which countries are considered high risk. ? Your parents were born in a high-risk country, and you have not been immunized against hepatitis B (hepatitis B vaccine). ? You have HIV or AIDS. ? You use needles to inject street drugs. ? You live with someone who has hepatitis B. ? You have had sex with someone who has hepatitis B. ? You get hemodialysis treatment. ? You take certain medicines for conditions, including cancer, organ transplantation, and autoimmune conditions.  Hepatitis C  Blood testing is recommended for: ? Everyone born from [birth year] through 1964. ? Anyone with known  risk factors for hepatitis C.  Sexually transmitted infections (STIs)  You should be screened for sexually transmitted infections (STIs) including gonorrhea and chlamydia if: ? You are sexually active and are younger than 26 years of age. ? You are older than 26 years of age and your health care provider tells you that you are at risk for this type of infection. ? Your sexual activity has changed since you were last screened and you are at an increased risk  for chlamydia or gonorrhea. Ask your health care provider if you are at risk.  If you do not have HIV, but are at risk, it may be recommended that you take a prescription medicine daily to prevent HIV infection. This is called pre-exposure prophylaxis (PrEP). You are considered at risk if: ? You are sexually active and do not regularly use condoms or know the HIV status of your partner(s). ? You take drugs by injection. ? You are sexually active with a partner who has HIV.  Talk with your health care provider about whether you are at high risk of being infected with HIV. If you choose to begin PrEP, you should first be tested for HIV. You should then be tested every 3 months for as long as you are taking PrEP. Pregnancy  If you are premenopausal and you may become pregnant, ask your health care provider about preconception counseling.  If you may become pregnant, take 400 to 800 micrograms (mcg) of folic acid every day.  If you want to prevent pregnancy, talk to your health care provider about birth control (contraception). Osteoporosis and menopause  Osteoporosis is a disease in which the bones lose minerals and strength with aging. This can result in serious bone fractures. Your risk for osteoporosis can be identified using a bone density scan.  If you are 55 years of age or older, or if you are at risk for osteoporosis and fractures, ask your health care provider if you should be screened.  Ask your health care provider whether you  should take a calcium or vitamin D supplement to lower your risk for osteoporosis.  Menopause may have certain physical symptoms and risks.  Hormone replacement therapy may reduce some of these symptoms and risks. Talk to your health care provider about whether hormone replacement therapy is right for you. Follow these instructions at home:  Schedule regular health, dental, and eye exams.  Stay current with your immunizations.  Do not use any tobacco products including cigarettes, chewing tobacco, or electronic cigarettes.  If you are pregnant, do not drink alcohol.  If you are breastfeeding, limit how much and how often you drink alcohol.  Limit alcohol intake to no more than 1 drink per day for nonpregnant women. One drink equals 12 ounces of beer, 5 ounces of wine, or 1 ounces of hard liquor.  Do not use street drugs.  Do not share needles.  Ask your health care provider for help if you need support or information about quitting drugs.  Tell your health care provider if you often feel depressed.  Tell your health care provider if you have ever been abused or do not feel safe at home. This information is not intended to replace advice given to you by your health care provider. Make sure you discuss any questions you have with your health care provider. Document Released: 11/06/2010 Document Revised: 09/29/2015 Document Reviewed: 01/25/2015 Elsevier Interactive Patient Education  [employer].

## 2017-10-17 LAB — HEPATITIS B SURFACE ANTIGEN: Hepatitis B Surface Ag: NEGATIVE

## 2017-10-17 LAB — HIV ANTIBODY (ROUTINE TESTING W REFLEX): HIV Screen 4th Generation wRfx: NONREACTIVE

## 2017-10-17 LAB — RPR: RPR: NONREACTIVE

## 2017-10-24 LAB — PAP IG, CT-NG, RFX HPV ASCU
Chlamydia, Nuc. Acid Amp: NEGATIVE
Gonococcus by Nucleic Acid Amp: NEGATIVE
PAP Smear Comment: 0

## 2017-10-24 LAB — HPV DNA PROBE HIGH RISK, AMPLIFIED: HPV, high-risk: NEGATIVE

## 2018-03-10 ENCOUNTER — Telehealth: Payer: Self-pay | Admitting: Family Medicine

## 2018-03-10 NOTE — Telephone Encounter (Signed)
Copied from CRM [id number]. Topic: Appointment Scheduling - Scheduling Inquiry for Clinic >> Mar 10, 2018 11:36 AM Lynne Logan D wrote: Reason for CRM: Pt would like to see Dr. Creta Levin regarding Acid Reflux symptoms/bloating/gas. Dr. Creta Levin unavailable until December. Please advise.

## 2018-03-10 NOTE — Telephone Encounter (Signed)
Do you want [place] to overbook you or put this patient in a same day slot?

## 2018-03-26 MED ORDER — FAMOTIDINE 20 MG PO TABS: 20.0000 mg | ORAL_TABLET | Freq: Two times a day (BID) | ORAL | 0 refills | Status: DC

## 2018-03-26 NOTE — Addendum Note (Signed)
Addended by: Collie SiadSTALLINGS, Crews Mccollam A on: 03/26/2018 01:36 PM   Modules accepted: Orders

## 2018-03-26 NOTE — Telephone Encounter (Signed)
Patient reports that she has been getting a feeling that is burning in her stomach with bloating and gas. She denies chest pain. Started a couple weeks ago around [employer].  She tried over the counter GAS-X pills. She feels like her gas pain is coming and going.   Take pepcid AC 20mg  bid AND align probiotic Pt to follow up on 04/25/18 She was advised to fast for that visit.

## 2018-04-14 ENCOUNTER — Telehealth: Payer: Self-pay | Admitting: Family Medicine

## 2018-04-14 NOTE — Telephone Encounter (Signed)
LVM for pt regarding their appt scheduled on 04/25/18. [facility], pt will need to be rescheduled. When pt calls back, please call [place] and ask for Tanya. Thank you!

## 2018-04-25 ENCOUNTER — Ambulatory Visit: Payer: Self-pay | Admitting: Family Medicine

## 2018-07-15 ENCOUNTER — Ambulatory Visit: Payer: Self-pay | Admitting: Family Medicine

## 2018-09-03 ENCOUNTER — Telehealth (INDEPENDENT_AMBULATORY_CARE_PROVIDER_SITE_OTHER): Payer: Federal, State, Local not specified - PPO | Admitting: Family Medicine

## 2018-09-03 ENCOUNTER — Other Ambulatory Visit: Payer: Self-pay

## 2018-09-03 ENCOUNTER — Encounter: Payer: Self-pay | Admitting: Family Medicine

## 2018-09-03 ENCOUNTER — Encounter

## 2018-09-03 DIAGNOSIS — R1013 Epigastric pain: Secondary | ICD-10-CM | POA: Diagnosis not present

## 2018-09-03 DIAGNOSIS — G8929 Other chronic pain: Secondary | ICD-10-CM | POA: Diagnosis not present

## 2018-09-03 DIAGNOSIS — K219 Gastro-esophageal reflux disease without esophagitis: Secondary | ICD-10-CM

## 2018-09-03 MED ORDER — OMEPRAZOLE 20 MG PO CPDR: 20.0000 mg | DELAYED_RELEASE_CAPSULE | Freq: Two times a day (BID) | ORAL | 3 refills | Status: DC

## 2018-09-03 NOTE — Progress Notes (Signed)
Pt c/o having some GERD problems. Says stomach has a burning feeling sometimes and when she swallows her back hurts. This has been going on for a couple months now. Gets a full feeling when she eats at work.

## 2018-09-03 NOTE — Progress Notes (Signed)
   Virtual Visit Note  I connected with patient on 09/03/18 at 323pm  by phone and verified that I am speaking with the correct person using two identifiers. Hannah Andrews is currently located at funeral and patient and mother is currently with them during visit. The provider, Myles Lipps, MD is located in their office at time of visit.  I discussed the limitations, risks, security and privacy concerns of performing an evaluation and management service by telephone and the availability of in person appointments. I also discussed with the patient that there may be a patient responsible charge related to this service. The patient expressed understanding and agreed to proceed.   CC: heartburn  HPI ? Heartburn for past several months Has been having burning sensation in epigastric region Always feeling bloated and full Getting very nauseous, reports reflux, occ emesis Tried pepcid which provided partial relief, takes prn Denies any caffeine intake, no sodas Denies any significant NSAID use Denies spicy foods Works out regularly, weight stable Has intermittent constipation Denies any black tarry stools or bright red blood stool Has had to miss work due to pain, nasuea  Allergies  Allergen Reactions  . Motrin [Ibuprofen] Swelling    Liquid--make eyes swell.      Prior to Admission medications   Medication Sig Start Date End Date Taking? Authorizing Provider  metroNIDAZOLE (FLAGYL) 500 MG tablet Take 1 tablet (500 mg total) by mouth 2 (two) times daily. Patient not taking: Reported on 10/16/2017 09/02/14   Marlon Pel, PA-C    No past medical history on file.  Past Surgical History:  Procedure Laterality Date  . APPENDECTOMY      Social History   Tobacco Use  . Smoking status: Never Smoker  . Smokeless tobacco: Never Used  Substance Use Topics  . Alcohol use: No    Family History  Problem Relation Age of Onset  . Diabetes Maternal Grandmother     ROS Per  hpi  Objective  Vitals as reported by the patient: none   ASSESSMENT and PLAN  1. Abdominal pain, chronic, epigastric - CBC; Future - Comprehensive metabolic panel; Future - Lipase; Future  2. Gastroesophageal reflux disease, esophagitis presence not specified Don't start medication until h pylori test done. Reviewed LFM recommendations. Trial of PPI. Consider GI referral. RTC precautions reviewed. - H. pylori breath test; Future  Other orders - omeprazole (PRILOSEC) 20 MG capsule; Take 1 capsule (20 mg total) by mouth 2 (two) times daily before a meal.  FOLLOW-UP: prn   The above assessment and management plan was discussed with the patient. The patient verbalized understanding of and has agreed to the management plan. Patient is aware to call the clinic if symptoms persist or worsen. Patient is aware when to return to the clinic for a follow-up visit. Patient educated on when it is appropriate to go to the emergency department.    I provided 18 minutes of non-face-to-face time during this encounter.  Myles Lipps, MD Primary Care at [facility] [address] [place], [place] [address] Ph.  [phone] Fax [phone]

## 2018-09-04 ENCOUNTER — Ambulatory Visit (INDEPENDENT_AMBULATORY_CARE_PROVIDER_SITE_OTHER): Payer: Federal, State, Local not specified - PPO | Admitting: Family Medicine

## 2018-09-04 ENCOUNTER — Other Ambulatory Visit: Payer: Self-pay

## 2018-09-04 DIAGNOSIS — K219 Gastro-esophageal reflux disease without esophagitis: Secondary | ICD-10-CM

## 2018-09-04 DIAGNOSIS — R1013 Epigastric pain: Secondary | ICD-10-CM | POA: Diagnosis not present

## 2018-09-04 DIAGNOSIS — G8929 Other chronic pain: Secondary | ICD-10-CM | POA: Diagnosis not present

## 2018-09-05 LAB — CBC
Hematocrit: 35.5 % (ref 34.0–46.6)
Hemoglobin: 11.8 g/dL (ref 11.1–15.9)
MCH: 27.4 pg (ref 26.6–33.0)
MCHC: 33.2 g/dL (ref 31.5–35.7)
MCV: 83 fL (ref 79–97)
Platelets: 225 10*3/uL (ref 150–450)
RBC: 4.3 x10E6/uL (ref 3.77–5.28)
RDW: 12.3 % (ref 11.7–15.4)
WBC: 5.8 10*3/uL (ref 3.4–10.8)

## 2018-09-05 LAB — COMPREHENSIVE METABOLIC PANEL
ALT: 11 IU/L (ref 0–32)
AST: 18 IU/L (ref 0–40)
Albumin/Globulin Ratio: 1.4 (ref 1.2–2.2)
Albumin: 4 g/dL (ref 3.9–5.0)
Alkaline Phosphatase: 56 IU/L (ref 39–117)
BUN/Creatinine Ratio: 12 (ref 9–23)
BUN: 9 mg/dL (ref 6–20)
Bilirubin Total: 0.2 mg/dL (ref 0.0–1.2)
CO2: 23 mmol/L (ref 20–29)
Calcium: 8.7 mg/dL (ref 8.7–10.2)
Chloride: 107 mmol/L — ABNORMAL HIGH (ref 96–106)
Creatinine, Ser: 0.74 mg/dL (ref 0.57–1.00)
GFR calc Af Amer: 129 mL/min/{1.73_m2} (ref >59)
GFR calc non Af Amer: 112 mL/min/{1.73_m2} (ref >59)
Globulin, Total: 2.8 g/dL (ref 1.5–4.5)
Glucose: 90 mg/dL (ref 65–99)
Potassium: 3.8 mmol/L (ref 3.5–5.2)
Sodium: 140 mmol/L (ref 134–144)
Total Protein: 6.8 g/dL (ref 6.0–8.5)

## 2018-09-05 LAB — H. PYLORI BREATH TEST: H pylori Breath Test: NEGATIVE

## 2018-09-05 LAB — LIPASE: Lipase: 32 U/L (ref 14–72)

## 2018-09-05 LAB — H. PYLORI BREATH COLLECTION

## 2019-04-28 ENCOUNTER — Encounter: Payer: Self-pay | Admitting: Family Medicine

## 2019-04-28 ENCOUNTER — Other Ambulatory Visit: Payer: Self-pay | Admitting: Family Medicine

## 2019-04-28 ENCOUNTER — Other Ambulatory Visit: Payer: Self-pay

## 2019-04-28 ENCOUNTER — Ambulatory Visit: Payer: Federal, State, Local not specified - PPO | Admitting: Family Medicine

## 2019-04-28 ENCOUNTER — Ambulatory Visit (INDEPENDENT_AMBULATORY_CARE_PROVIDER_SITE_OTHER): Payer: Federal, State, Local not specified - PPO

## 2019-04-28 VITALS — BP 120/80 | HR 81 | Temp 98.6°F | Ht 62.0 in | Wt 184.2 lb

## 2019-04-28 DIAGNOSIS — M79671 Pain in right foot: Secondary | ICD-10-CM

## 2019-04-28 MED ORDER — ACETAMINOPHEN ER 650 MG PO TBCR: 650.0000 mg | EXTENDED_RELEASE_TABLET | Freq: Three times a day (TID) | ORAL | 1 refills | Status: DC | PRN

## 2019-04-28 NOTE — Patient Instructions (Addendum)
If you have lab work done today you will be contacted with your lab results within the next 2 weeks.  If you have not heard from [place] then please contact us. The fastest way to get your results is to register for My Chart.   IF you received an x-ray today, you will receive an invoice from [facility]iology. Please contact [facility] Radiology at [phone] with questions or concerns regarding your invoice.   IF you received labwork today, you will receive an invoice from [place]. Please contact [facility] at [phone] with questions or concerns regarding your invoice.   Our billing staff will not be able to assist you with questions regarding bills from these companies.  You will be contacted with the lab results as soon as they are available. The fastest way to get your results is to activate your My Chart account. Instructions are located on the last page of this paperwork. If you have not heard from [place] regarding the results in 2 weeks, please contact this office.     Bunion  A bunion is a bump on the base of the big toe that forms when the bones of the big toe joint move out of position. Bunions may be small at first, but they often get larger over time. They can make walking painful. What are the causes? A bunion may be caused by:  Wearing narrow or pointed shoes that force the big toe to press against the other toes.  Abnormal foot development that causes the foot to roll inward (pronate).  Changes in the foot that are caused by certain diseases, such as rheumatoid arthritis or polio.  A foot injury. What increases the risk? The following factors may make you more likely to develop this condition:  Wearing shoes that squeeze the toes together.  Having certain diseases, such as: ? Rheumatoid arthritis. ? Polio. ? Cerebral palsy.  Having family members who have bunions.  Being born with a foot deformity, such as flat feet or low arches.  Doing activities that  put a lot of pressure on the feet, such as ballet dancing. What are the signs or symptoms? The main symptom of a bunion is a noticeable bump on the big toe. Other symptoms may include:  Pain.  Swelling around the big toe.  Redness and inflammation.  Thick or hardened skin on the big toe or between the toes.  Stiffness or loss of motion in the big toe.  Trouble with walking. How is this diagnosed? A bunion may be diagnosed based on your symptoms, medical history, and activities. You may have tests, such as:  X-rays. These allow your health care provider to check the position of the bones in your foot and look for damage to your joint. They also help your health care provider determine the severity of your bunion and the best way to treat it.  Joint aspiration. In this test, a sample of fluid is removed from the toe joint. This test may be done if you are in a lot of pain. It helps rule out diseases that cause painful swelling of the joints, such as arthritis. How is this treated? Treatment depends on the severity of your symptoms. The goal of treatment is to relieve symptoms and prevent the bunion from getting worse. Your health care provider may recommend:  Wearing shoes that have a wide toe box.  Using bunion pads to cushion the affected area.  Taping your toes together to keep them in a  normal position.  Placing a device inside your shoe (orthotics) to help reduce pressure on your toe joint.  Taking medicine to ease pain, inflammation, and swelling.  Applying heat or ice to the affected area.  Doing stretching exercises.  Surgery to remove scar tissue and move the toes back into their normal position. This treatment is rare. Follow these instructions at home: Managing pain, stiffness, and swelling   If directed, put ice on the painful area: ? Put ice in a plastic bag. ? Place a towel between your skin and the bag. ? Leave the ice on for 20 minutes, 2-3 times a  day. Activity   If directed, apply heat to the affected area before you exercise. Use the heat source that your health care provider recommends, such as a moist heat pack or a heating pad. ? Place a towel between your skin and the heat source. ? Leave the heat on for 20-30 minutes. ? Remove the heat if your skin turns bright red. This is especially important if you are unable to feel pain, heat, or cold. You may have a greater risk of getting burned.  Do exercises as told by your health care provider. General instructions  Support your toe joint with proper footwear, shoe padding, or taping as told by your health care provider.  Take over-the-counter and prescription medicines only as told by your health care provider.  Keep all follow-up visits as told by your health care provider. This is important. Contact a health care provider if your symptoms:  Get worse.  Do not improve in 2 weeks. Get help right away if you have:  Severe pain and trouble with walking. Summary  A bunion is a bump on the base of the big toe that forms when the bones of the big toe joint move out of position.  Bunions can make walking painful.  Treatment depends on the severity of your symptoms.  Support your toe joint with proper footwear, shoe padding, or taping as told by your health care provider. This information is not intended to replace advice given to you by your health care provider. Make sure you discuss any questions you have with your health care provider. Document Released: 04/23/2005 Document Revised: 10/28/2017 Document Reviewed: 09/03/2017 Elsevier Patient Education  2020 [employer].

## 2019-04-28 NOTE — Progress Notes (Signed)
   12/22/20202:29 PM  Ferd Hibbs 1991/02/05, 28 y.o., female [id number]  Chief Complaint  Patient presents with  . Foot Pain    R foot inflamed pt pt looks red. pt states it causes her pain when she walks on it.    HPI:   Patient is a 28 y.o. female who presents today for right foot pain and swelling along base of 5th toe  She reports that pain has been present for a while but recently getting worse Denies any trauma Reports at times red and swollen Has tried changing shoes wo benefit  Depression screen [facility] 2/9 04/28/2019 09/03/2018 10/16/2017  Decreased Interest 0 0 0  Down, Depressed, Hopeless 0 0 0  PHQ - 2 Score 0 0 0    Fall Risk  04/28/2019 09/03/2018 10/16/2017 11/28/2016  Falls in the past year? 0 0 No No  Follow up - Falls evaluation completed - -     Allergies  Allergen Reactions  . Motrin [Ibuprofen] Swelling    Liquid--make eyes swell.      Prior to Admission medications   Not on File    History reviewed. No pertinent past medical history.  Past Surgical History:  Procedure Laterality Date  . APPENDECTOMY      Social History   Tobacco Use  . Smoking status: Never Smoker  . Smokeless tobacco: Never Used  Substance Use Topics  . Alcohol use: No    Family History  Problem Relation Age of Onset  . Diabetes Maternal Grandmother     ROS Per hpi  OBJECTIVE:  Today's Vitals   04/28/19 1423  BP: 120/80  Pulse: 81  Temp: 98.6 F (37 C)  SpO2: 99%  Weight: 184 lb 3.2 oz (83.6 kg)  Height: 5\' 2"  (1.575 m)   Body mass index is 33.69 kg/m.   Physical Exam  Gen: AAOx3, NAD Right foot: FROM, no gross deformities, mild TTP at 5th MTP joint. NVI.  No results found for this or any previous visit (from the past 24 hour(s)).  DG Foot Complete Right  Result Date: 04/28/2019 CLINICAL DATA:  Right foot pain. EXAM: RIGHT FOOT COMPLETE - 3+ VIEW COMPARISON:  No prior. FINDINGS: No acute bony or joint abnormality. No evidence of fracture or  dislocation. No radiopaque foreign body. IMPRESSION: No acute abnormality. Electronically Signed   By: Marcello Moores  Register   On: 04/28/2019 14:52     ASSESSMENT and PLAN  1. Right foot pain Discussed supportive measures, new meds r/se/b and RTC precautions. Patient educational handout given. - Ambulatory referral to Podiatry  Other orders - acetaminophen (TYLENOL 8 HOUR ARTHRITIS PAIN) 650 MG CR tablet; Take 1 tablet (650 mg total) by mouth every 8 (eight) hours as needed for pain.  Return if symptoms worsen or fail to improve.    Rutherford Guys, MD Primary Care at [place], [place] [address] Ph.  [phone] Fax [phone]

## 2019-05-04 ENCOUNTER — Ambulatory Visit: Payer: Federal, State, Local not specified - PPO | Admitting: Podiatry

## 2019-09-03 ENCOUNTER — Ambulatory Visit: Payer: Federal, State, Local not specified - PPO | Admitting: Family Medicine

## 2019-09-15 ENCOUNTER — Other Ambulatory Visit: Payer: Self-pay

## 2019-09-15 ENCOUNTER — Telehealth: Payer: Self-pay | Admitting: Family Medicine

## 2019-09-15 ENCOUNTER — Encounter: Payer: Self-pay | Admitting: Family Medicine

## 2019-09-15 ENCOUNTER — Ambulatory Visit (INDEPENDENT_AMBULATORY_CARE_PROVIDER_SITE_OTHER): Payer: Federal, State, Local not specified - PPO | Admitting: Family Medicine

## 2019-09-15 VITALS — BP 107/69 | HR 67 | Temp 98.4°F | Ht 62.0 in | Wt 179.0 lb

## 2019-09-15 DIAGNOSIS — F411 Generalized anxiety disorder: Secondary | ICD-10-CM | POA: Diagnosis not present

## 2019-09-15 DIAGNOSIS — L2082 Flexural eczema: Secondary | ICD-10-CM

## 2019-09-15 MED ORDER — TRIAMCINOLONE ACETONIDE 0.1 % EX CREA: 1.0000 "application " | TOPICAL_CREAM | Freq: Two times a day (BID) | CUTANEOUS | 4 refills | Status: DC

## 2019-09-15 MED ORDER — FLUOCINOLONE ACETONIDE 0.01 % EX CREA: TOPICAL_CREAM | Freq: Two times a day (BID) | CUTANEOUS | 4 refills | Status: DC

## 2019-09-15 MED ORDER — ESCITALOPRAM OXALATE 5 MG PO TABS: 5.0000 mg | ORAL_TABLET | Freq: Every day | ORAL | 3 refills | Status: DC

## 2019-09-15 NOTE — Addendum Note (Signed)
Addended by: Myles Lipps on: 09/15/2019 11:50 AM   Modules accepted: Orders

## 2019-09-15 NOTE — Telephone Encounter (Signed)
Giving to [place] for review, Confirmation will be fxaed to pharmacy to ok cream

## 2019-09-15 NOTE — Patient Instructions (Addendum)
   If you have lab work done today you will be contacted with your lab results within the next 2 weeks.  If you have not heard from us then please contact us. The fastest way to get your results is to register for My Chart.   IF you received an x-ray today, you will receive an invoice from [facility]. Please contact [facility] at [phone] with questions or concerns regarding your invoice.   IF you received labwork today, you will receive an invoice from [facility]. Please contact [facility] at [phone] with questions or concerns regarding your invoice.   Our billing staff will not be able to assist you with questions regarding bills from these companies.  You will be contacted with the lab results as soon as they are available. The fastest way to get your results is to activate your My Chart account. Instructions are located on the last page of this paperwork. If you have not heard from us regarding the results in 2 weeks, please contact this office.      Managing Anxiety, Adult After being diagnosed with an anxiety disorder, you may be relieved to know why you have felt or behaved a certain way. You may also feel overwhelmed about the treatment ahead and what it will mean for your life. With care and support, you can manage this condition and recover from it. How to manage lifestyle changes Managing stress and anxiety  Stress is your body's reaction to life changes and events, both good and bad. Most stress will last just a few hours, but stress can be ongoing and can lead to more than just stress. Although stress can play a major role in anxiety, it is not the same as anxiety. Stress is usually caused by something external, such as a deadline, test, or competition. Stress normally passes after the triggering event has ended.  Anxiety is caused by something internal, such as imagining a terrible outcome or worrying that something will go wrong that will devastate  you. Anxiety often does not go away even after the triggering event is over, and it can become long-term (chronic) worry. It is important to understand the differences between stress and anxiety and to manage your stress effectively so that it does not lead to an anxious response. Talk with your health care provider or a counselor to learn more about reducing anxiety and stress. He or she may suggest tension reduction techniques, such as:  Music therapy. This can include creating or listening to music that you enjoy and that inspires you.  Mindfulness-based meditation. This involves being aware of your normal breaths while not trying to control your breathing. It can be done while sitting or walking.  Centering prayer. This involves focusing on a word, phrase, or sacred image that means something to you and brings you peace.  Deep breathing. To do this, expand your stomach and inhale slowly through your nose. Hold your breath for 3-5 seconds. Then exhale slowly, letting your stomach muscles relax.  Self-talk. This involves identifying thought patterns that lead to anxiety reactions and changing those patterns.  Muscle relaxation. This involves tensing muscles and then relaxing them. Choose a tension reduction technique that suits your lifestyle and personality. These techniques take time and practice. Set aside 5-15 minutes a day to do them. Therapists can offer counseling and training in these techniques. The training to help with anxiety may be covered by some insurance plans. Other things you can do to manage stress and   anxiety include:  Keeping a stress/anxiety diary. This can help you learn what triggers your reaction and then learn ways to manage your response.  Thinking about how you react to certain situations. You may not be able to control everything, but you can control your response.  Making time for activities that help you relax and not feeling guilty about spending your time in  this way.  Visual imagery and yoga can help you stay calm and relax.  Medicines Medicines can help ease symptoms. Medicines for anxiety include:  Anti-anxiety drugs.  Antidepressants. Medicines are often used as a primary treatment for anxiety disorder. Medicines will be prescribed by a health care provider. When used together, medicines, psychotherapy, and tension reduction techniques may be the most effective treatment. Relationships Relationships can play a big part in helping you recover. Try to spend more time connecting with trusted friends and family members. Consider going to couples counseling, taking family education classes, or going to family therapy. Therapy can help you and others better understand your condition. How to recognize changes in your anxiety Everyone responds differently to treatment for anxiety. Recovery from anxiety happens when symptoms decrease and stop interfering with your daily activities at home or work. This may mean that you will start to:  Have better concentration and focus. Worry will interfere less in your daily thinking.  Sleep better.  Be less irritable.  Have more energy.  Have improved memory. It is important to recognize when your condition is getting worse. Contact your health care provider if your symptoms interfere with home or work and you feel like your condition is not improving. Follow these instructions at home: Activity  Exercise. Most adults should do the following: ? Exercise for at least 150 minutes each week. The exercise should increase your heart rate and make you sweat (moderate-intensity exercise). ? Strengthening exercises at least twice a week.  Get the right amount and quality of sleep. Most adults need 7-9 hours of sleep each night. Lifestyle   Eat a healthy diet that includes plenty of vegetables, fruits, whole grains, low-fat dairy products, and lean protein. Do not eat a lot of foods that are high in solid  fats, added sugars, or salt.  Make choices that simplify your life.  Do not use any products that contain nicotine or tobacco, such as cigarettes, e-cigarettes, and chewing tobacco. If you need help quitting, ask your health care provider.  Avoid caffeine, alcohol, and certain over-the-counter cold medicines. These may make you feel worse. Ask your pharmacist which medicines to avoid. General instructions  Take over-the-counter and prescription medicines only as told by your health care provider.  Keep all follow-up visits as told by your health care provider. This is important. Where to find support You can get help and support from these sources:  Self-help groups.  Online and community organizations.  A trusted spiritual leader.  Couples counseling.  Family education classes.  Family therapy. Where to find more information You may find that joining a support group helps you deal with your anxiety. The following sources can help you locate counselors or support groups near you:  Mental Health America: www.mentalhealthamerica.net  Anxiety and Depression Association of America (ADAA): www.adaa.org  National Alliance on Mental Illness (NAMI): www.nami.org Contact a health care provider if you:  Have a hard time staying focused or finishing daily tasks.  Spend many hours a day feeling worried about everyday life.  Become exhausted by worry.  Start to have headaches, feel tense,   or have nausea.  Urinate more than normal.  Have diarrhea. Get help right away if you have:  A racing heart and shortness of breath.  Thoughts of hurting yourself or others. If you ever feel like you may hurt yourself or others, or have thoughts about taking your own life, get help right away. You can go to your nearest emergency department or call:  Your local emergency services (911 in the [place]  A suicide crisis helpline, such as the National Suicide Prevention Lifeline at  1-800-273-8255. This is open 24 hours a day. Summary  Taking steps to learn and use tension reduction techniques can help calm you and help prevent triggering an anxiety reaction.  When used together, medicines, psychotherapy, and tension reduction techniques may be the most effective treatment.  Family, friends, and partners can play a big part in helping you recover from an anxiety disorder. This information is not intended to replace advice given to you by your health care provider. Make sure you discuss any questions you have with your health care provider. Document Revised: 09/23/2018 Document Reviewed: 09/23/2018 Elsevier Patient Education  2020 Elsevier Inc.  

## 2019-09-15 NOTE — Progress Notes (Addendum)
5/11/202110:01 AM  Hannah Andrews 04/05/1991, 29 y.o., female [id number]  Chief Complaint  Patient presents with  . Transitions Of Care  . Eczema    request for fluocinonide cream   . Anxiety    gad 7     HPI:   Patient is a 29 y.o. female with past medical history significant for eczema who presents today to discuss her anxiety  Previous PCP Dr Nolon Rod  She has noticed increased nervousness, wringing of her hands, gets withdrawn in groups of people, minor panic attacks She reports sx have been present for years Her mother has anxiety Has not tried anything for coping Tends to avoid social situations with new people Gad 7 noted  Tends to get eczema during the spring every year Inside of her elbows Uses topical steroids prn  GAD 7 : Generalized Anxiety Score 09/15/2019  Nervous, Anxious, on Edge 2  Control/stop worrying 1  Worry too much - different things 3  Trouble relaxing 1  Restless 1  Easily annoyed or irritable 2  Afraid - awful might happen 3  Total GAD 7 Score 13  Anxiety Difficulty Not difficult at all     Depression screen [facility] 2/9 09/15/2019 04/28/2019 09/03/2018  Decreased Interest 0 0 0  Down, Depressed, Hopeless 0 0 0  PHQ - 2 Score 0 0 0    Fall Risk  09/15/2019 04/28/2019 09/03/2018 10/16/2017 11/28/2016  Falls in the past year? 0 0 0 No No  Number falls in past yr: 0 - - - -  Injury with Fall? 0 - - - -  Follow up Falls evaluation completed - Falls evaluation completed - -     Allergies  Allergen Reactions  . Motrin [Ibuprofen] Swelling    Liquid--make eyes swell.      Prior to Admission medications   Medication Sig Start Date End Date Taking? Authorizing Provider  Multiple Vitamins-Minerals (MULTI ADULT GUMMIES PO) Take by mouth.   Yes [provider]  acetaminophen (TYLENOL 8 HOUR ARTHRITIS PAIN) 650 MG CR tablet Take 1 tablet (650 mg total) by mouth every 8 (eight) hours as needed for pain. Patient not taking: Reported on  09/15/2019 04/28/19   Rutherford Guys, MD    No past medical history on file.  Past Surgical History:  Procedure Laterality Date  . APPENDECTOMY      Social History   Tobacco Use  . Smoking status: Never Smoker  . Smokeless tobacco: Never Used  Substance Use Topics  . Alcohol use: No    Family History  Problem Relation Age of Onset  . Diabetes Maternal Grandmother   . Diabetes Maternal Grandfather     ROS Per hpi  OBJECTIVE:  Today's Vitals   09/15/19 0953  BP: 107/69  Pulse: 67  Temp: 98.4 F (36.9 C)  SpO2: 99%  Weight: 179 lb (81.2 kg)  Height: 5\' 2"  (1.575 m)   Body mass index is 32.74 kg/m.   Physical Exam Vitals and nursing note reviewed.  Constitutional:      Appearance: She is well-developed.  HENT:     Head: Normocephalic and atraumatic.  Eyes:     General: No scleral icterus.    Conjunctiva/sclera: Conjunctivae normal.     Pupils: Pupils are equal, round, and reactive to light.  Pulmonary:     Effort: Pulmonary effort is normal.  Musculoskeletal:     Cervical back: Neck supple.  Skin:    General: Skin is warm and dry.  Neurological:  Mental Status: She is alert and oriented to person, place, and time.  Psychiatric:        Attention and Perception: Attention and perception normal.        Mood and Affect: Affect normal. Mood is anxious.        Speech: Speech normal.        Behavior: Behavior normal.        Thought Content: Thought content normal.     No results found for this or any previous visit (from the past 24 hour(s)).  No results found.   ASSESSMENT and PLAN  1. Generalized anxiety disorder Discussed treatment options, new meds r/se/b and RTC precautions. Patient educational handout given. - Ambulatory referral to Psychology  2. Flexural eczema Discussed skin care, new med r/se/b.  - rx for triamcinolone sent  Other orders - Multiple Vitamins-Minerals (MULTI ADULT GUMMIES PO); Take by mouth. - escitalopram  (LEXAPRO) 5 MG tablet; Take 1 tablet (5 mg total) by mouth at bedtime.   Return in about 4 weeks (around 10/13/2019).    Myles Lipps, MD Primary Care at [facility] [address] [place], [place] [address] Ph.  [phone] Fax [phone]

## 2019-09-15 NOTE — Telephone Encounter (Signed)
[facility] calling regarding fluocinolone (VANOS) 0.01 % cream [[id number]]  It comes in 0.1 %    Cream does not come in 01.01 , but it does come in other forms    [facility] [address] [place] - [address]  [address], [place] [address]  Phone:  [phone] Fax:  [phone]    Please advise

## 2019-09-17 ENCOUNTER — Telehealth: Payer: Self-pay | Admitting: Family Medicine

## 2019-09-17 ENCOUNTER — Telehealth: Payer: Self-pay

## 2019-09-17 NOTE — Telephone Encounter (Signed)
Error

## 2019-09-17 NOTE — Telephone Encounter (Signed)
Confirmation of eczema cream has been faxed to the pharmacy

## 2019-10-07 ENCOUNTER — Ambulatory Visit: Payer: Federal, State, Local not specified - PPO | Admitting: Registered Nurse

## 2019-10-08 ENCOUNTER — Encounter: Payer: Self-pay | Admitting: Registered Nurse

## 2019-10-08 ENCOUNTER — Ambulatory Visit: Payer: Federal, State, Local not specified - PPO | Admitting: Psychology

## 2019-10-13 ENCOUNTER — Encounter: Payer: Self-pay | Admitting: Family Medicine

## 2019-10-13 ENCOUNTER — Other Ambulatory Visit: Payer: Self-pay

## 2019-10-13 ENCOUNTER — Ambulatory Visit: Payer: Federal, State, Local not specified - PPO | Admitting: Family Medicine

## 2019-10-13 VITALS — BP 111/69 | HR 66 | Temp 98.5°F | Ht 62.0 in | Wt 179.4 lb

## 2019-10-13 DIAGNOSIS — Z30011 Encounter for initial prescription of contraceptive pills: Secondary | ICD-10-CM

## 2019-10-13 DIAGNOSIS — R5383 Other fatigue: Secondary | ICD-10-CM | POA: Diagnosis not present

## 2019-10-13 DIAGNOSIS — F411 Generalized anxiety disorder: Secondary | ICD-10-CM | POA: Diagnosis not present

## 2019-10-13 LAB — CBC
Hematocrit: 41.6 % (ref 34.0–46.6)
Hemoglobin: 13.3 g/dL (ref 11.1–15.9)
MCH: 26.7 pg (ref 26.6–33.0)
MCHC: 32 g/dL (ref 31.5–35.7)
MCV: 84 fL (ref 79–97)
Platelets: 244 10*3/uL (ref 150–450)
RBC: 4.98 x10E6/uL (ref 3.77–5.28)
RDW: 12.8 % (ref 11.7–15.4)
WBC: 7 10*3/uL (ref 3.4–10.8)

## 2019-10-13 LAB — POCT URINE PREGNANCY: Preg Test, Ur: NEGATIVE

## 2019-10-13 MED ORDER — DROSPIRENONE-ETHINYL ESTRADIOL 3-0.02 MG PO TABS: 1.0000 | ORAL_TABLET | Freq: Every day | ORAL | 11 refills | Status: DC

## 2019-10-13 MED ORDER — CITALOPRAM HYDROBROMIDE 10 MG PO TABS: 10.0000 mg | ORAL_TABLET | Freq: Every day | ORAL | 2 refills | Status: DC

## 2019-10-13 NOTE — Patient Instructions (Signed)
° ° ° °  If you have lab work done today you will be contacted with your lab results within the next 2 weeks.  If you have not heard from us then please contact us. The fastest way to get your results is to register for My Chart. ° ° °IF you received an x-ray today, you will receive an invoice from [facility]. Please contact [facility] at [phone] with questions or concerns regarding your invoice.  ° °IF you received labwork today, you will receive an invoice from [facility]. Please contact [facility] at [phone] with questions or concerns regarding your invoice.  ° °Our billing staff will not be able to assist you with questions regarding bills from these companies. ° °You will be contacted with the lab results as soon as they are available. The fastest way to get your results is to activate your My Chart account. Instructions are located on the last page of this paperwork. If you have not heard from us regarding the results in 2 weeks, please contact this office. °  ° ° ° °

## 2019-10-13 NOTE — Progress Notes (Signed)
6/8/202110:14 AM  Hannah Andrews Oct 02, 1990, 29 y.o., female [id number]  Chief Complaint  Patient presents with  . Anxiety    follow up  . Fatigue    very tired all the time    HPI:   Patient is a 29 y.o. female with past medical history significant for eczema and anxiety who presents today for routine followup  Last OV may 2021 - started lexapro 5mg , referred for counseling  She finds that lexapro, which she takes at bedtime, making her very tired She wants to make sure she does not have anemia or pregnant She missed her counseling appointment due to work It seems to be working for her anxiety No missed periods, LMP may 18th, not BC, uses condoms inconsistently, stopped BC due to hirsutism Does not smoke, no migraines, no blood clots Gad 7 noted  GAD 7 : Generalized Anxiety Score 10/13/2019 09/15/2019  Nervous, Anxious, on Edge 3 2  Control/stop worrying 1 1  Worry too much - different things 3 3  Trouble relaxing 0 1  Restless 0 1  Easily annoyed or irritable 1 2  Afraid - awful might happen 1 3  Total GAD 7 Score 9 13  Anxiety Difficulty - Not difficult at all     Depression screen [facility] 2/9 09/15/2019 04/28/2019 09/03/2018  Decreased Interest 0 0 0  Down, Depressed, Hopeless 0 0 0  PHQ - 2 Score 0 0 0    Fall Risk  10/13/2019 09/15/2019 04/28/2019 09/03/2018 10/16/2017  Falls in the past year? 0 0 0 0 No  Number falls in past yr: 0 0 - - -  Injury with Fall? 0 0 - - -  Follow up - Falls evaluation completed - Falls evaluation completed -     Allergies  Allergen Reactions  . Motrin [Ibuprofen] Swelling    Liquid--make eyes swell.      Prior to Admission medications   Medication Sig Start Date End Date Taking? Authorizing Provider  escitalopram (LEXAPRO) 5 MG tablet Take 1 tablet (5 mg total) by mouth at bedtime. 09/15/19  Yes 11/15/19, MD  Multiple Vitamins-Minerals (MULTI ADULT GUMMIES PO) Take by mouth.   Yes [provider]  triamcinolone  cream (KENALOG) 0.1 % Apply 1 application topically 2 (two) times daily. Use for eczema flareup for no more than 7 days at a time. 09/15/19  Yes 11/15/19, MD    History reviewed. No pertinent past medical history.  Past Surgical History:  Procedure Laterality Date  . APPENDECTOMY      Social History   Tobacco Use  . Smoking status: Never Smoker  . Smokeless tobacco: Never Used  Substance Use Topics  . Alcohol use: No    Family History  Problem Relation Age of Onset  . Diabetes Maternal Grandmother   . Diabetes Maternal Grandfather     Review of Systems  Constitutional: Negative for chills and fever.  Respiratory: Negative for cough and shortness of breath.   Cardiovascular: Negative for chest pain, palpitations and leg swelling.  Gastrointestinal: Negative for abdominal pain, nausea and vomiting.   Per hpi  OBJECTIVE:  Today's Vitals   10/13/19 1006  BP: 111/69  Pulse: 66  Temp: 98.5 F (36.9 C)  SpO2: 100%  Weight: 179 lb 6.4 oz (81.4 kg)  Height: 5\' 2"  (1.575 m)   Body mass index is 32.81 kg/m.   Physical Exam Vitals and nursing note reviewed.  Constitutional:      Appearance: She is  well-developed.  HENT:     Head: Normocephalic and atraumatic.     Mouth/Throat:     Pharynx: No oropharyngeal exudate.  Eyes:     General: No scleral icterus.    Conjunctiva/sclera: Conjunctivae normal.     Pupils: Pupils are equal, round, and reactive to light.  Cardiovascular:     Rate and Rhythm: Normal rate and regular rhythm.     Heart sounds: Normal heart sounds. No murmur. No friction rub. No gallop.   Pulmonary:     Effort: Pulmonary effort is normal.     Breath sounds: Normal breath sounds. No wheezing or rales.  Musculoskeletal:     Cervical back: Neck supple.  Skin:    General: Skin is warm and dry.  Neurological:     Mental Status: She is alert and oriented to person, place, and time.     Results for orders placed or performed in visit on  10/13/19 (from the past 24 hour(s))  POCT urine pregnancy     Status: None   Collection Time: 10/13/19 10:21 AM  Result Value Ref Range   Preg Test, Ur Negative Negative    No results found.   ASSESSMENT and PLAN  1. Generalized anxiety disorder Intolerant of lexapro, change to celexa, reschedule counseling  2. Other fatigue - POCT urine pregnancy - CBC  3. Encounter for initial prescription of contraceptive pills rx yaz, reviewed r/se/b  Other orders - citalopram (CELEXA) 10 MG tablet; Take 1 tablet (10 mg total) by mouth daily. - drospirenone-ethinyl estradiol (YAZ) 3-0.02 MG tablet; Take 1 tablet by mouth daily.  Return for CPE with pap, (fasting labs 3-4 days prior).

## 2019-11-16 ENCOUNTER — Ambulatory Visit: Payer: Federal, State, Local not specified - PPO

## 2019-11-16 ENCOUNTER — Ambulatory Visit (INDEPENDENT_AMBULATORY_CARE_PROVIDER_SITE_OTHER): Payer: Federal, State, Local not specified - PPO | Admitting: Family Medicine

## 2019-11-16 ENCOUNTER — Other Ambulatory Visit: Payer: Self-pay

## 2019-11-16 DIAGNOSIS — Z0000 Encounter for general adult medical examination without abnormal findings: Secondary | ICD-10-CM | POA: Diagnosis not present

## 2019-11-17 ENCOUNTER — Other Ambulatory Visit: Payer: Self-pay | Admitting: Family Medicine

## 2019-11-17 LAB — CMP14+EGFR
ALT: 12 IU/L (ref 0–32)
AST: 13 IU/L (ref 0–40)
Albumin/Globulin Ratio: 1.2 (ref 1.2–2.2)
Albumin: 4.1 g/dL (ref 3.9–5.0)
Alkaline Phosphatase: 50 IU/L (ref 48–121)
BUN/Creatinine Ratio: 8 — ABNORMAL LOW (ref 9–23)
BUN: 6 mg/dL (ref 6–20)
Bilirubin Total: 0.3 mg/dL (ref 0.0–1.2)
CO2: 19 mmol/L — ABNORMAL LOW (ref 20–29)
Calcium: 9.3 mg/dL (ref 8.7–10.2)
Chloride: 99 mmol/L (ref 96–106)
Creatinine, Ser: 0.73 mg/dL (ref 0.57–1.00)
GFR calc Af Amer: 131 mL/min/{1.73_m2} (ref >59)
GFR calc non Af Amer: 113 mL/min/{1.73_m2} (ref >59)
Globulin, Total: 3.4 g/dL (ref 1.5–4.5)
Glucose: 85 mg/dL (ref 65–99)
Potassium: 4.1 mmol/L (ref 3.5–5.2)
Sodium: 132 mmol/L — ABNORMAL LOW (ref 134–144)
Total Protein: 7.5 g/dL (ref 6.0–8.5)

## 2019-11-17 LAB — LIPID PANEL
Chol/HDL Ratio: 2.7 ratio (ref 0.0–4.4)
Cholesterol, Total: 174 mg/dL (ref 100–199)
HDL: 64 mg/dL (ref >39)
LDL Chol Calc (NIH): 92 mg/dL (ref 0–99)
Triglycerides: 102 mg/dL (ref 0–149)
VLDL Cholesterol Cal: 18 mg/dL (ref 5–40)

## 2019-11-17 LAB — CBC
Hematocrit: 38.3 % (ref 34.0–46.6)
Hemoglobin: 12.6 g/dL (ref 11.1–15.9)
MCH: 27 pg (ref 26.6–33.0)
MCHC: 32.9 g/dL (ref 31.5–35.7)
MCV: 82 fL (ref 79–97)
Platelets: 249 10*3/uL (ref 150–450)
RBC: 4.66 x10E6/uL (ref 3.77–5.28)
RDW: 12.7 % (ref 11.7–15.4)
WBC: 7.4 10*3/uL (ref 3.4–10.8)

## 2019-11-17 LAB — RPR: RPR Ser Ql: NONREACTIVE

## 2019-11-17 LAB — HIV ANTIBODY (ROUTINE TESTING W REFLEX): HIV Screen 4th Generation wRfx: NONREACTIVE

## 2019-11-17 LAB — BETA HCG QUANT (REF LAB): hCG Quant: 86889 m[IU]/mL

## 2019-11-19 ENCOUNTER — Other Ambulatory Visit: Payer: Self-pay

## 2019-11-19 ENCOUNTER — Encounter: Payer: Self-pay | Admitting: Family Medicine

## 2019-11-19 ENCOUNTER — Encounter: Payer: Federal, State, Local not specified - PPO | Admitting: Family Medicine

## 2019-11-19 NOTE — Patient Instructions (Signed)
° ° ° °  If you have lab work done today you will be contacted with your lab results within the next 2 weeks.  If you have not heard from us then please contact us. The fastest way to get your results is to register for My Chart. ° ° °IF you received an x-ray today, you will receive an invoice from [facility]. Please contact [facility] at [phone] with questions or concerns regarding your invoice.  ° °IF you received labwork today, you will receive an invoice from [facility]. Please contact [facility] at [phone] with questions or concerns regarding your invoice.  ° °Our billing staff will not be able to assist you with questions regarding bills from these companies. ° °You will be contacted with the lab results as soon as they are available. The fastest way to get your results is to activate your My Chart account. Instructions are located on the last page of this paperwork. If you have not heard from us regarding the results in 2 weeks, please contact this office. °  ° ° ° °

## 2019-12-10 NOTE — Progress Notes (Signed)
This encounter was created in error - please disregard.

## 2020-07-18 IMAGING — DX DG FOOT COMPLETE 3+V*R*
3 series · 3 of 3 positions shown · non-contrast
Comparison: No prior.

CLINICAL DATA: Right foot pain.

EXAM:
RIGHT FOOT COMPLETE - 3+ VIEW

[foot ap]
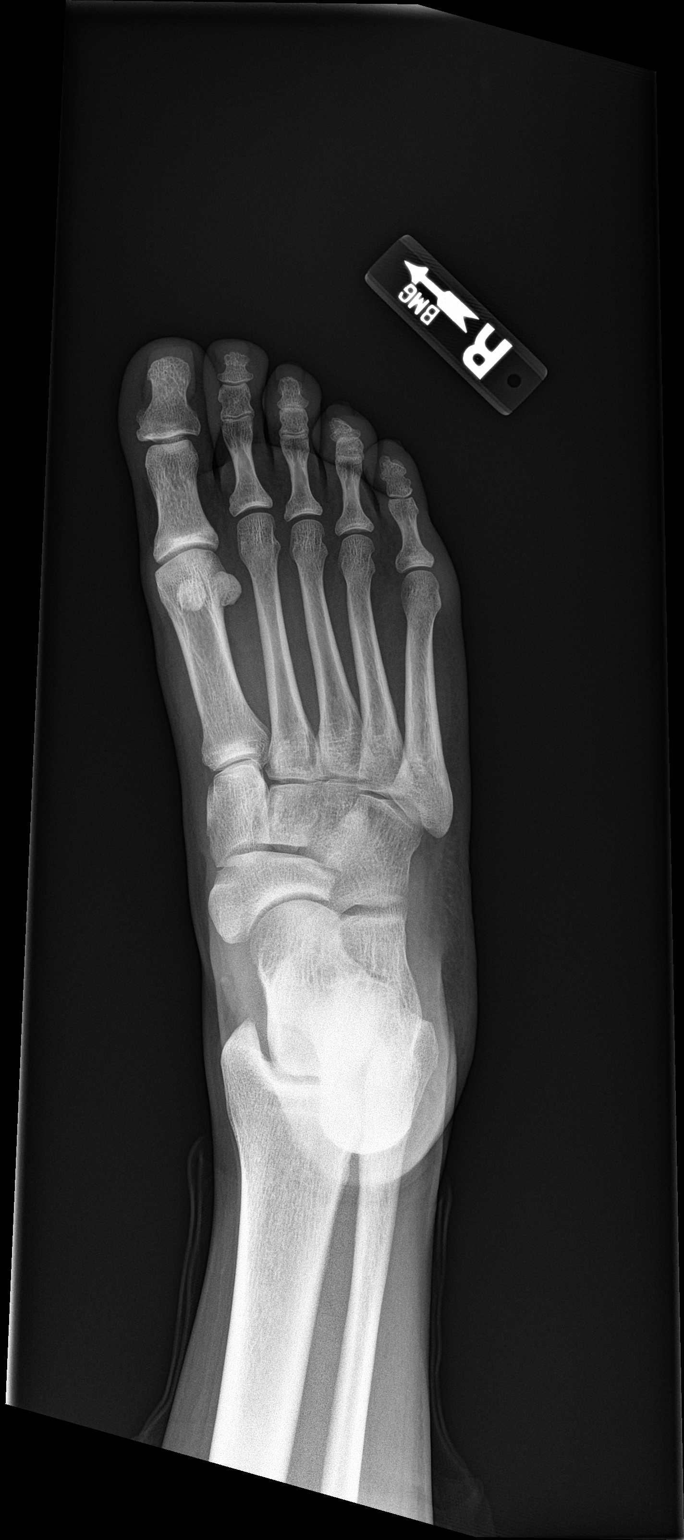

[foot obl]
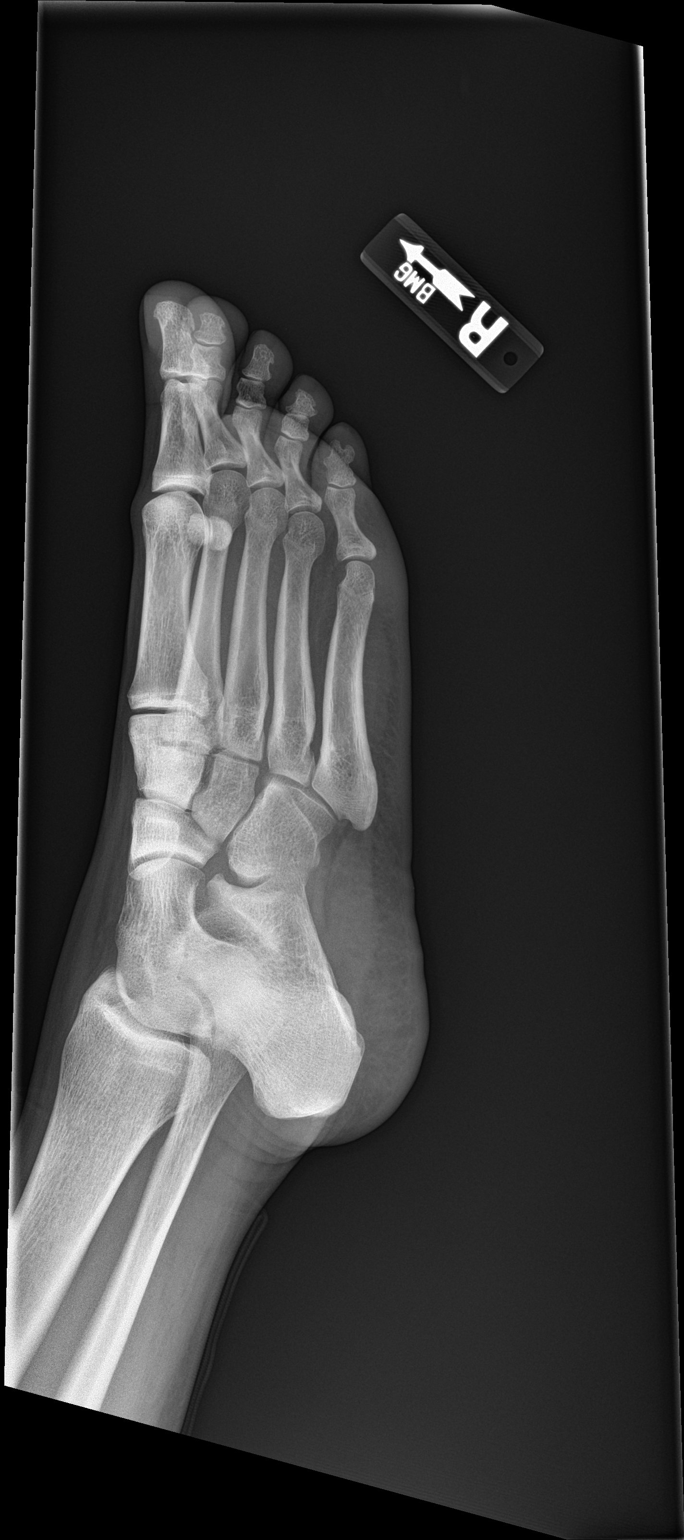

[foot lat]
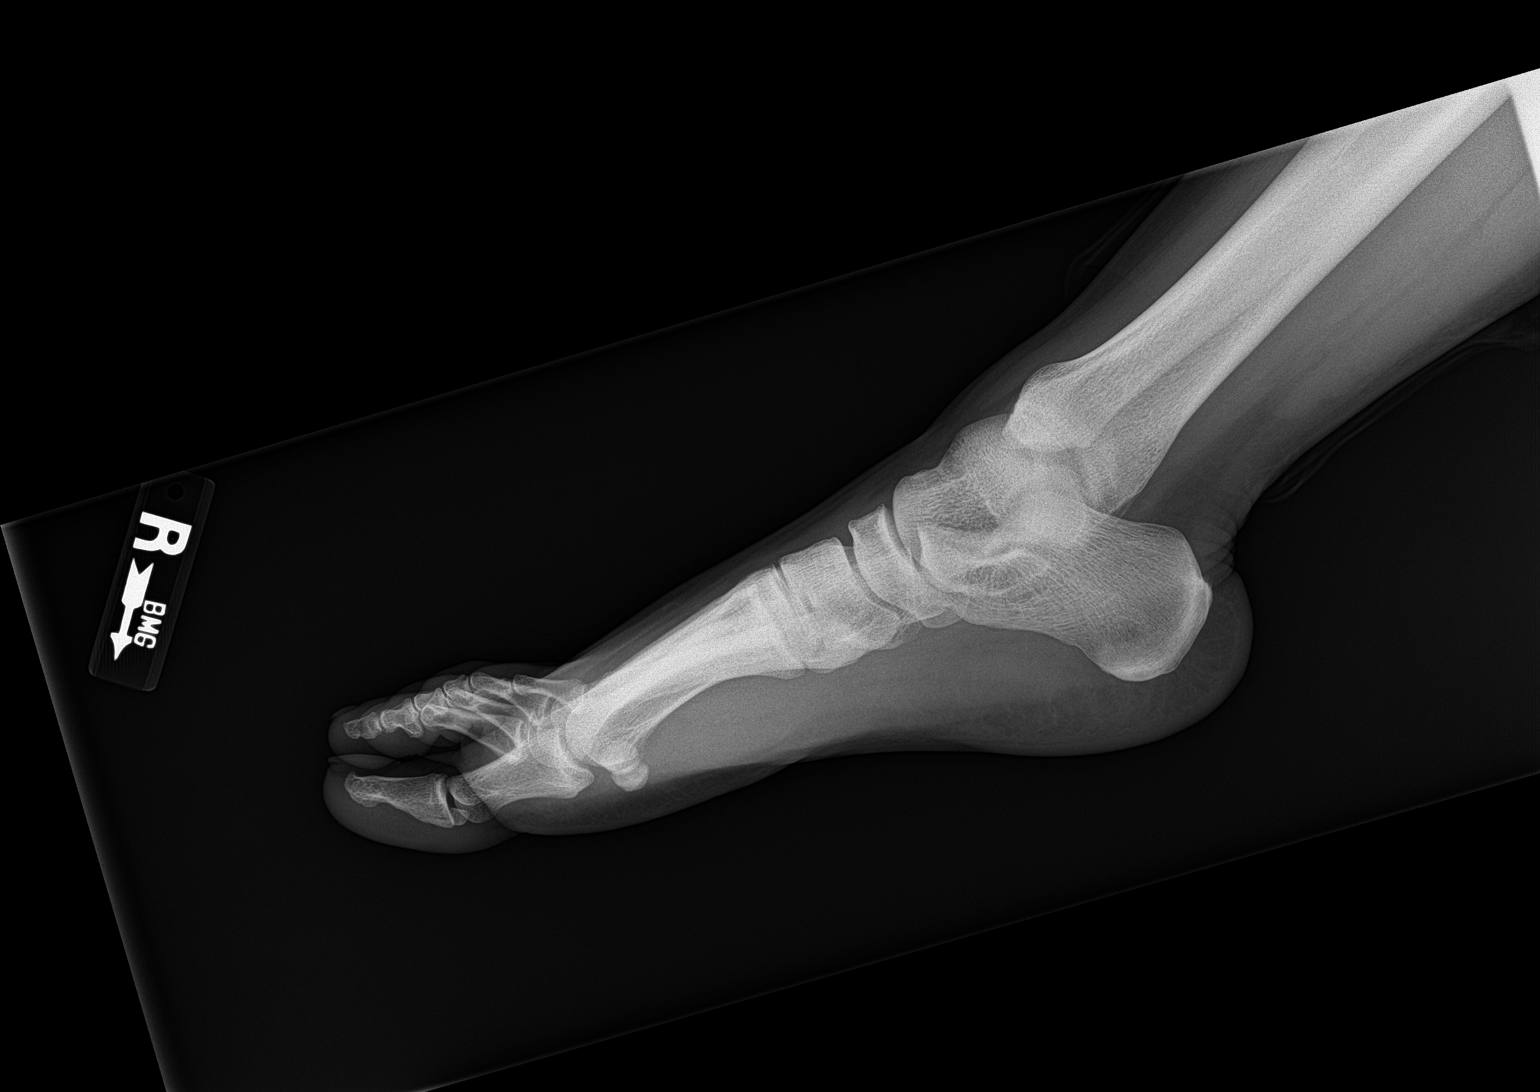

[3 of 3 positions shown; findings below may reference images not displayed]

FINDINGS: No acute bony or joint abnormality. No evidence of fracture or
dislocation. No radiopaque foreign body.
IMPRESSION: No acute abnormality.

## 2020-08-30 NOTE — Progress Notes (Signed)
Patient did not show for appointment.   

## 2020-08-31 ENCOUNTER — Encounter: Payer: PRIVATE HEALTH INSURANCE | Admitting: Family

## 2020-08-31 DIAGNOSIS — Z7689 Persons encountering health services in other specified circumstances: Secondary | ICD-10-CM

## 2020-11-03 NOTE — Progress Notes (Signed)
Virtual Visit via Telephone Note  I connected with Hannah Andrews, on 11/04/2020 at 10:41 AM by telephone due to the COVID-19 pandemic and verified that I am speaking with the correct person using two identifiers.  Due to current restrictions/limitations of in-office visits due to the COVID-19 pandemic, this scheduled clinical appointment was converted to a telehealth visit.   Consent: I discussed the limitations, risks, security and privacy concerns of performing an evaluation and management service by telephone and the availability of in person appointments. I also discussed with the patient that there may be a patient responsible charge related to this service. The patient expressed understanding and agreed to proceed.   Location of Patient: Home  Location of Provider: [facility]  Persons participating in Telemedicine visit: Delos Haring, NP Margorie John, CMA  History of Present Illness: Hannah Andrews is a 30 year-old female who presents to establish care.   Current issues and/or concerns: None.   No past medical history on file. Allergies  Allergen Reactions   Motrin [Ibuprofen] Swelling    Liquid--make eyes swell.      Current Outpatient Medications on File Prior to Visit  Medication Sig Dispense Refill   Multiple Vitamins-Minerals (MULTI ADULT GUMMIES PO) Take by mouth. (Patient not taking: Reported on 11/19/2019)     triamcinolone cream (KENALOG) 0.1 % Apply 1 application topically 2 (two) times daily. Use for eczema flareup for no more than 7 days at a time. 30 g 4   No current facility-administered medications on file prior to visit.    Observations/Objective: Alert and oriented x 3. Not in acute distress. Physical examination not completed as this is a telemedicine visit.  Assessment and Plan: 1. Encounter to establish care: - Patient presents today to establish care.  - Return for annual physical  examination, labs, and health maintenance. Arrive fasting meaning having no food for at least 8 hours prior to appointment. You may have only water or black coffee. Please take scheduled medications as normal.  Follow Up Instructions: Return for annual physical exam.    Patient was given clear instructions to go to Emergency Department or return to medical center if symptoms don't improve, worsen, or new problems develop.The patient verbalized understanding.  I discussed the assessment and treatment plan with the patient. The patient was provided an opportunity to ask questions and all were answered. The patient agreed with the plan and demonstrated an understanding of the instructions.   The patient was advised to call back or seek an in-person evaluation if the symptoms worsen or if the condition fails to improve as anticipated.   I provided 5 minutes total of non-face-to-face time during this encounter.   Rema Fendt, NP  [facility] at [facility] [place], [place] [phone] 11/04/2020, 10:41 AM

## 2020-11-04 ENCOUNTER — Telehealth (INDEPENDENT_AMBULATORY_CARE_PROVIDER_SITE_OTHER): Payer: Federal, State, Local not specified - PPO | Admitting: Family

## 2020-11-04 ENCOUNTER — Other Ambulatory Visit: Payer: Self-pay

## 2020-11-04 DIAGNOSIS — Z7689 Persons encountering health services in other specified circumstances: Secondary | ICD-10-CM | POA: Diagnosis not present

## 2020-11-04 NOTE — Progress Notes (Signed)
Pt presents for telemedicine to establish care  

## 2020-11-14 DIAGNOSIS — U071 COVID-19: Secondary | ICD-10-CM | POA: Diagnosis not present

## 2020-12-01 DIAGNOSIS — K080 Exfoliation of teeth due to systemic causes: Secondary | ICD-10-CM | POA: Diagnosis not present

## 2020-12-07 DIAGNOSIS — K029 Dental caries, unspecified: Secondary | ICD-10-CM | POA: Diagnosis not present

## 2020-12-07 DIAGNOSIS — K006 Disturbances in tooth eruption: Secondary | ICD-10-CM | POA: Diagnosis not present

## 2020-12-20 ENCOUNTER — Other Ambulatory Visit: Payer: Self-pay

## 2020-12-20 ENCOUNTER — Encounter: Payer: Federal, State, Local not specified - PPO | Admitting: Family

## 2020-12-20 ENCOUNTER — Encounter: Payer: Self-pay | Admitting: Family Medicine

## 2020-12-20 ENCOUNTER — Ambulatory Visit (INDEPENDENT_AMBULATORY_CARE_PROVIDER_SITE_OTHER): Payer: Federal, State, Local not specified - PPO | Admitting: Family Medicine

## 2020-12-20 ENCOUNTER — Other Ambulatory Visit (HOSPITAL_COMMUNITY)
Admission: RE | Admit: 2020-12-20 | Discharge: 2020-12-20 | Disposition: A | Discharge status: Home / Self Care | Payer: Federal, State, Local not specified - PPO | Source: Ambulatory Visit | Attending: Family Medicine | Admitting: Family Medicine

## 2020-12-20 VITALS — BP 108/68 | HR 60 | Resp 16 | Ht 62.5 in | Wt 191.0 lb

## 2020-12-20 DIAGNOSIS — E6609 Other obesity due to excess calories: Secondary | ICD-10-CM | POA: Diagnosis not present

## 2020-12-20 DIAGNOSIS — Z6834 Body mass index (BMI) 34.0-34.9, adult: Secondary | ICD-10-CM

## 2020-12-20 DIAGNOSIS — Z1159 Encounter for screening for other viral diseases: Secondary | ICD-10-CM

## 2020-12-20 DIAGNOSIS — Z113 Encounter for screening for infections with a predominantly sexual mode of transmission: Secondary | ICD-10-CM | POA: Diagnosis not present

## 2020-12-20 DIAGNOSIS — Z01419 Encounter for gynecological examination (general) (routine) without abnormal findings: Secondary | ICD-10-CM | POA: Insufficient documentation

## 2020-12-21 LAB — LIPID PANEL
Chol/HDL Ratio: 3.7 ratio (ref 0.0–4.4)
Cholesterol, Total: 235 mg/dL — ABNORMAL HIGH (ref 100–199)
HDL: 63 mg/dL (ref >39)
LDL Chol Calc (NIH): 153 mg/dL — ABNORMAL HIGH (ref 0–99)
Triglycerides: 110 mg/dL (ref 0–149)
VLDL Cholesterol Cal: 19 mg/dL (ref 5–40)

## 2020-12-21 LAB — CMP14+EGFR
ALT: 12 IU/L (ref 0–32)
AST: 23 IU/L (ref 0–40)
Albumin/Globulin Ratio: 1.5 (ref 1.2–2.2)
Albumin: 4.7 g/dL (ref 3.9–5.0)
Alkaline Phosphatase: 58 IU/L (ref 44–121)
BUN/Creatinine Ratio: 14 (ref 9–23)
BUN: 11 mg/dL (ref 6–20)
Bilirubin Total: <0.2 mg/dL (ref 0.0–1.2)
CO2: 22 mmol/L (ref 20–29)
Calcium: 9.3 mg/dL (ref 8.7–10.2)
Chloride: 104 mmol/L (ref 96–106)
Creatinine, Ser: 0.79 mg/dL (ref 0.57–1.00)
Globulin, Total: 3.1 g/dL (ref 1.5–4.5)
Glucose: 82 mg/dL (ref 65–99)
Potassium: 4.4 mmol/L (ref 3.5–5.2)
Sodium: 139 mmol/L (ref 134–144)
Total Protein: 7.8 g/dL (ref 6.0–8.5)
eGFR: 104 mL/min/{1.73_m2} (ref >59)

## 2020-12-21 LAB — CBC WITH DIFFERENTIAL/PLATELET
Basophils Absolute: 0 10*3/uL (ref 0.0–0.2)
Basos: 0 %
EOS (ABSOLUTE): 0.2 10*3/uL (ref 0.0–0.4)
Eos: 3 %
Hematocrit: 41.1 % (ref 34.0–46.6)
Hemoglobin: 13.7 g/dL (ref 11.1–15.9)
Immature Grans (Abs): 0 10*3/uL (ref 0.0–0.1)
Immature Granulocytes: 0 %
Lymphocytes Absolute: 2.7 10*3/uL (ref 0.7–3.1)
Lymphs: 38 %
MCH: 27 pg (ref 26.6–33.0)
MCHC: 33.3 g/dL (ref 31.5–35.7)
MCV: 81 fL (ref 79–97)
Monocytes Absolute: 0.5 10*3/uL (ref 0.1–0.9)
Monocytes: 7 %
Neutrophils Absolute: 3.6 10*3/uL (ref 1.4–7.0)
Neutrophils: 52 %
Platelets: 267 10*3/uL (ref 150–450)
RBC: 5.08 x10E6/uL (ref 3.77–5.28)
RDW: 12.9 % (ref 11.7–15.4)
WBC: 7 10*3/uL (ref 3.4–10.8)

## 2020-12-21 LAB — HEPATITIS C ANTIBODY: Hep C Virus Ab: <0.1 s/co ratio (ref 0.0–0.9)

## 2020-12-21 NOTE — Progress Notes (Signed)
Established Patient Office Visit  Subjective:  Patient ID: Hannah Andrews, female    DOB: [birth year]  Age: 30 y.o. MRN: [id number]  CC:  Chief Complaint  Patient presents with   Annual Exam    HPI Hannah Andrews presents for routine annual exam. Denies acute complaints or concern.   No past medical history on file.  Past Surgical History:  Procedure Laterality Date   APPENDECTOMY      Family History  Problem Relation Age of Onset   Diabetes Maternal Grandmother    Diabetes Maternal Grandfather     Social History   Socioeconomic History   Marital status: Single    Spouse name: Not on file   Number of children: Not on file   Years of education: Not on file   Highest education level: Not on file  Occupational History   Not on file  Tobacco Use   Smoking status: Never   Smokeless tobacco: Never  Substance and Sexual Activity   Alcohol use: No   Drug use: No   Sexual activity: Yes    Birth control/protection: Condom  Other Topics Concern   Not on file  Social History Narrative   Not on file   Social Determinants of Health   Financial Resource Strain: Not on file  Food Insecurity: Not on file  Transportation Needs: Not on file  Physical Activity: Not on file  Stress: Not on file  Social Connections: Not on file  Intimate Partner Violence: Not on file    Outpatient Medications Prior to Visit  Medication Sig Dispense Refill   Multiple Vitamins-Minerals (MULTI ADULT GUMMIES PO) Take by mouth.     triamcinolone cream (KENALOG) 0.1 % Apply 1 application topically 2 (two) times daily. Use for eczema flareup for no more than 7 days at a time. 30 g 4   No facility-administered medications prior to visit.    Allergies  Allergen Reactions   Motrin [Ibuprofen] Swelling    Liquid--make eyes swell.      ROS Review of Systems  All other systems reviewed and are negative.    Objective:    Physical Exam Vitals and nursing note reviewed.   Constitutional:      General: She is not in acute distress.    Appearance: She is obese.  HENT:     Head: Normocephalic.     Right Ear: Tympanic membrane, ear canal and external ear normal.     Left Ear: Tympanic membrane, ear canal and external ear normal.     Nose: Nose normal.     Mouth/Throat:     Mouth: Mucous membranes are moist.     Pharynx: Oropharynx is clear.  Eyes:     Conjunctiva/sclera: Conjunctivae normal.     Pupils: Pupils are equal, round, and reactive to light.  Neck:     Thyroid: No thyromegaly.  Cardiovascular:     Rate and Rhythm: Normal rate and regular rhythm.     Heart sounds: Normal heart sounds. No murmur heard. Pulmonary:     Effort: Pulmonary effort is normal.     Breath sounds: Normal breath sounds.  Abdominal:     General: There is no distension.     Palpations: Abdomen is soft.     Hernia: There is no hernia in the left inguinal area or right inguinal area.  Genitourinary:    General: Normal vulva.     Exam position: Supine.     Vagina: Normal.     Cervix: Normal.  Uterus: Normal.      Adnexa: Right adnexa normal and left adnexa normal.  Musculoskeletal:        General: Normal range of motion.     Cervical back: Normal range of motion and neck supple.  Skin:    General: Skin is warm and dry.  Neurological:     General: No focal deficit present.     Mental Status: She is alert and oriented to person, place, and time.  Psychiatric:        Mood and Affect: Mood normal.        Behavior: Behavior normal.    BP 108/68   Pulse 60   Resp 16   Ht 5' 2.5" (1.588 m)   Wt 191 lb (86.6 kg)   LMP 12/15/2020 (Exact Date)   SpO2 98%   BMI 34.38 kg/m  Wt Readings from Last 3 Encounters:  12/20/20 191 lb (86.6 kg)  11/19/19 181 lb 12.8 oz (82.5 kg)  10/13/19 179 lb 6.4 oz (81.4 kg)     Health Maintenance Due  Topic Date Due   COVID-19 Vaccine (1) Never done   TETANUS/TDAP  Never done   PAP-Cervical Cytology Screening  10/16/2020    PAP SMEAR-Modifier  10/16/2020   INFLUENZA VACCINE  12/05/2020    There are no preventive care reminders to display for this patient.  No results found for: TSH Lab Results  Component Value Date   WBC 7.0 12/20/2020   HGB 13.7 12/20/2020   HCT 41.1 12/20/2020   MCV 81 12/20/2020   PLT 267 12/20/2020   Lab Results  Component Value Date   NA 139 12/20/2020   K 4.4 12/20/2020   CO2 22 12/20/2020   GLUCOSE 82 12/20/2020   BUN 11 12/20/2020   CREATININE 0.79 12/20/2020   BILITOT <0.2 12/20/2020   ALKPHOS 58 12/20/2020   AST 23 12/20/2020   ALT 12 12/20/2020   PROT 7.8 12/20/2020   ALBUMIN 4.7 12/20/2020   CALCIUM 9.3 12/20/2020   EGFR 104 12/20/2020   Lab Results  Component Value Date   CHOL 235 (H) 12/20/2020   Lab Results  Component Value Date   HDL 63 12/20/2020   Lab Results  Component Value Date   LDLCALC 153 (H) 12/20/2020   Lab Results  Component Value Date   TRIG 110 12/20/2020   Lab Results  Component Value Date   CHOLHDL 3.7 12/20/2020   No results found for: HGBA1C    Assessment & Plan:   1. Well woman exam with routine gynecological exam Routine labs ordered - unremarkable exam - Lipid panel - CMP14+EGFR - CBC with Differential - Cytology - PAP([place])  2. Need for hepatitis C screening test  - Hepatitis C antibody  3. Class 1 obesity due to excess calories without serious comorbidity with body mass index (BMI) of 34.0 to 34.9 in adult Discussed dietary and acitiviy options with goal of 3-5 lbs/mo wt loss   No orders of the defined types were placed in this encounter.   Follow-up: Return in 1 year (on 12/20/2021).    Becky Sax, MD

## 2020-12-26 LAB — CYTOLOGY - PAP
Chlamydia: NEGATIVE
Comment: NEGATIVE
Comment: NEGATIVE
Comment: NEGATIVE
Comment: NORMAL
Diagnosis: NEGATIVE
HSV1: NEGATIVE
HSV2: NEGATIVE
Neisseria Gonorrhea: NEGATIVE
Trichomonas: NEGATIVE

## 2020-12-30 DIAGNOSIS — K006 Disturbances in tooth eruption: Secondary | ICD-10-CM | POA: Diagnosis not present

## 2021-03-01 ENCOUNTER — Ambulatory Visit: Payer: Federal, State, Local not specified - PPO | Admitting: Nurse Practitioner

## 2021-03-01 ENCOUNTER — Other Ambulatory Visit: Payer: Self-pay

## 2021-03-01 ENCOUNTER — Encounter: Payer: Self-pay | Admitting: Nurse Practitioner

## 2021-03-01 VITALS — BP 124/81 | HR 104 | Resp 18

## 2021-03-01 DIAGNOSIS — R21 Rash and other nonspecific skin eruption: Secondary | ICD-10-CM | POA: Diagnosis not present

## 2021-03-01 MED ORDER — FAMOTIDINE 20 MG PO TABS: 20.0000 mg | ORAL_TABLET | Freq: Two times a day (BID) | ORAL | 0 refills | Status: DC

## 2021-03-01 MED ORDER — TRIAMCINOLONE ACETONIDE 0.1 % EX CREA: 1.0000 "application " | TOPICAL_CREAM | Freq: Two times a day (BID) | CUTANEOUS | 4 refills | Status: DC

## 2021-03-01 MED ORDER — PREDNISONE 20 MG PO TABS: 20.0000 mg | ORAL_TABLET | Freq: Every day | ORAL | 0 refills | Status: AC

## 2021-03-01 MED ORDER — CETIRIZINE HCL 10 MG PO TABS: 10.0000 mg | ORAL_TABLET | Freq: Every day | ORAL | 0 refills | Status: DC

## 2021-03-01 NOTE — Patient Instructions (Signed)
Rash:  Will order zyrtec   Will order Pepcid  Will order prednisone  Will refill triamcinolone cream    Follow up:  Follow up if needed

## 2021-03-01 NOTE — Progress Notes (Signed)
@Patient  ID: , female    DOB: 12/09/1990, 30 y.o.   MRN: [id number]  Chief Complaint  Patient presents with   Rash    Referring provider: [id number], NP  HPI   Patient presents today for rash.  She states for the past couple days she has had a rash that has spread to multiple parts of her body.  It is a fine flat rash on arms, chest, abdomen abdomen, left upper thigh.  Patient does have history of eczema and states that at times she does have issues when the weather changes.  She also remembered that she did change her close detergent.  She states that she was treated by the previous detergent.  She does have a prescription for triamcinolone cream that she needs refilled.     Allergies  Allergen Reactions   Motrin [Ibuprofen] Swelling    Liquid--make eyes swell.       There is no immunization history on file for this patient.  History reviewed. No pertinent past medical history.  Tobacco History: Social History   Tobacco Use  Smoking Status Never  Smokeless Tobacco Never   Counseling given: Yes   Outpatient Encounter Medications as of 03/01/2021  Medication Sig   cetirizine (ZYRTEC) 10 MG tablet Take 1 tablet (10 mg total) by mouth daily.   famotidine (PEPCID) 20 MG tablet Take 1 tablet (20 mg total) by mouth 2 (two) times daily.   predniSONE (DELTASONE) 20 MG tablet Take 1 tablet (20 mg total) by mouth daily with breakfast for 5 days.   Multiple Vitamins-Minerals (MULTI ADULT GUMMIES PO) Take by mouth.   triamcinolone cream (KENALOG) 0.1 % Apply 1 application topically 2 (two) times daily. Use for eczema flareup for no more than 7 days at a time.   [DISCONTINUED] triamcinolone cream (KENALOG) 0.1 % Apply 1 application topically 2 (two) times daily. Use for eczema flareup for no more than 7 days at a time.   No facility-administered encounter medications on file as of 03/01/2021.     Review of Systems  Review of Systems  Constitutional:  Negative.   HENT: Negative.    Respiratory:  Positive for cough and shortness of breath.   Cardiovascular: Negative.   Gastrointestinal: Negative.   Skin:  Positive for rash.  Allergic/Immunologic: Negative.   Neurological: Negative.   Psychiatric/Behavioral: Negative.        Physical Exam  BP 124/81   Pulse (!) 104   Resp 18   SpO2 98%   Wt Readings from Last 5 Encounters:  12/20/20 191 lb (86.6 kg)  11/19/19 181 lb 12.8 oz (82.5 kg)  10/13/19 179 lb 6.4 oz (81.4 kg)  09/15/19 179 lb (81.2 kg)  04/28/19 184 lb 3.2 oz (83.6 kg)     Physical Exam Vitals and nursing note reviewed.  Constitutional:      General: She is not in acute distress.    Appearance: She is well-developed.  Cardiovascular:     Rate and Rhythm: Normal rate and regular rhythm.  Pulmonary:     Effort: Pulmonary effort is normal.     Breath sounds: Normal breath sounds.  Skin:    Findings: Rash (Macular rash noted to bilateral arms, chest, abdomen, and left leg) present.  Neurological:     Mental Status: She is alert and oriented to person, place, and time.  Psychiatric:        Mood and Affect: Mood normal.        Behavior: Behavior normal.  Assessment & Plan:   Rash Will order zyrtec   Will order Pepcid  Will order prednisone  Will refill triamcinolone cream    Follow up:  Follow up if needed     Ivonne Andrew, NP 03/01/2021

## 2021-03-01 NOTE — Assessment & Plan Note (Signed)
Will order zyrtec   Will order Pepcid  Will order prednisone  Will refill triamcinolone cream    Follow up:  Follow up if needed

## 2021-08-07 ENCOUNTER — Ambulatory Visit: Payer: Federal, State, Local not specified - PPO | Admitting: Physician Assistant

## 2021-08-07 ENCOUNTER — Encounter: Payer: Self-pay | Admitting: Physician Assistant

## 2021-08-07 VITALS — BP 124/75 | HR 60 | Temp 98.6°F | Resp 18 | Ht 62.0 in | Wt 199.0 lb

## 2021-08-07 DIAGNOSIS — T148XXA Other injury of unspecified body region, initial encounter: Secondary | ICD-10-CM | POA: Diagnosis not present

## 2021-08-07 DIAGNOSIS — S90822A Blister (nonthermal), left foot, initial encounter: Secondary | ICD-10-CM

## 2021-08-07 NOTE — Patient Instructions (Signed)
I do encourage you to make sure that you are wearing good supportive socks and shoes. ? ?Please let us know if there is anything else we can do for you ? ?Kennieth Rad, PA-C ?Physician Assistant ?[employer] ?[email or web address] ? ? ?Blisters, Adult ?A blister is a raised bubble of skin filled with liquid. Blisters often develop on skin that rubs or presses against another surface repeatedly (friction blister). Friction blisters can occur on any part of the body, but they usually form on the hands or the feet. Long-term pressure on that same area of skin can also cause the area of skin to become hardened. This hardened skin is called a callus. ?What are the causes? ?Besides friction, blisters can be caused by: ?An injury, such as a burn. ?An allergic reaction. ?An infection. ?Exposure to irritating chemicals. ?Friction blisters often occur in areas with a lot of heat and moisture. Friction blisters often result from: ?Sports. ?Repetitive activities. ?Using tools and doing other activities without wearing gloves. ?Shoes that are too tight or too loose. ?What are the signs or symptoms? ?A blister is often round and looks like a bump. It may hurt or feel itchy. Before a blister forms, your skin may: ?Turn red. ?Feel warm. ?Itch. ?Be painful to the touch. ?How is this diagnosed? ?A blister is diagnosed with a physical exam. ?How is this treated? ?Treatment usually involves protecting the area where the blister has formed until your skin has healed. Treatments may include: ?Using a bandage (dressing) to cover your blister. ?Putting extra padding around and over your blister so that the blister does not rub on anything. ?Applying antibiotic ointment. ?Most blisters break open, dry up, and go away on their own within 1-2 weeks. Blisters that are very painful may be drained before they break open on their own. If your blister is large or painful, your health care  provider can drain it. ?Follow these instructions at home: ?Medicines ?Take or apply over-the-counter and prescription medicines only as told by your health care provider. ?If you were prescribed an antibiotic medicine, take or apply it as told by your health care provider. Do not stop using the antibiotic even if you start to feel better. ?Skin care ?Do not pop your blister. This can cause infection. ?Keep your blister clean and dry. This helps to prevent infection. ?Before you swim or use a hot tub, cover your blister with a waterproof dressing. ?Protect the area where your blister has formed as told by your health care provider. ?Follow instructions from your health care provider about how to take care of your blister. Make sure you: ?Wash your hands with soap and water for at least 20 seconds before and after you change your dressing. If soap and water are not available, use hand sanitizer. ?Change your dressing as told by your health care provider. ?Infection signs ?Check your blister every day for signs of infection. Check for: ?More redness, swelling, or pain. ?More fluid or blood. ?Warmth. ?Pus or a bad smell. ?General instructions ?If you have a blister on a foot or toe, wear different shoes until your blister heals. ?Avoid the activity that caused the blister until your blister heals. ?Keep all follow-up visits as told by your health care provider. This is important. ?How is this prevented? ?Taking these steps can help to prevent blisters that are caused by friction. Make sure you: ?Wear comfortable shoes that fit well. ?Always wear socks with shoes. ?Wear extra socks or use tape, dressings,  or pads over blister-prone areas as needed. You may also apply petroleum jelly under dressings in blister-prone areas. ?Wear protective gear, such as gloves, when taking part in sports or activities that can cause blisters. ?Wear loose-fitting, moisture-wicking clothes when taking part in sports or activities. ?Use  powders as needed to keep your feet dry. ?Contact a health care provider if: ?You have more redness, swelling, or pain around your blister. ?You have more fluid or blood coming from your blister. ?Your blister feels warm to the touch. ?You have pus or a bad smell coming from your blister. ?You have a fever or chills. ?Your blister gets better and then it gets worse. ?Summary ?A blister is a raised bubble of skin filled with liquid. Blisters often develop on skin that rubs or presses against another surface repeatedly (friction blister). ?Most blisters break open, dry up, and go away on their own within 1-2 weeks. ?Keep your blister clean and dry. This helps to prevent infection. ?Take steps to help prevent blisters that are caused by friction. ?Contact a health care provider if you have signs of infection. ?This information is not intended to replace advice given to you by your health care provider. Make sure you discuss any questions you have with your health care provider. ?Document Revised: 03/12/2019 Document Reviewed: 03/12/2019 ?Elsevier Patient Education ? [place]. ? ?

## 2021-08-07 NOTE — Progress Notes (Signed)
Patient has eaten today. Patient has not taken medication. ?Patient reports a "cyst" appearing last Tuesday/Wednesday and bursting. Patient reports a second "cyst" appearing Saturday morning. ?

## 2021-08-07 NOTE — Progress Notes (Signed)
? ?Established Patient Office Visit ? ?Subjective:  ?Patient ID: Hannah Andrews, female    DOB: 12/21/1990  Age: 31 y.o. MRN: [id number] ? ?CC:  ?Chief Complaint  ?Patient presents with  ? Foot Injury  ? ? ?HPI ?Hannah Andrews states that she is a bump on her left heel on Thursday 5 days ago.  States that she first noticed blood on her sock and in her shoe.  States that the area was tender to touch.  States that she then noticed a second small bump come up in the same area that was also red.  States that both have resolved.  Denies injury or trauma. ? 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  States that she does have a requirement to wear leather shoes, states that she feels her shoes are supportive and comfortable. ? ?History reviewed. No pertinent past medical history. ? ?Past Surgical History:  ?Procedure Laterality Date  ? APPENDECTOMY    ? ? ?Family History  ?Problem Relation Age of Onset  ? Diabetes Maternal Grandmother   ? Diabetes Maternal Grandfather   ? ? ?Social History  ? ?Socioeconomic History  ? Marital status: Single  ?  Spouse name: Not on file  ? Number of children: Not on file  ? Years of education: Not on file  ? Highest education level: Not on file  ?Occupational History  ? Not on file  ?Tobacco Use  ? Smoking status: Never  ? Smokeless tobacco: Never  ?Vaping Use  ? Vaping Use: Never used  ?Substance and Sexual Activity  ? Alcohol use: No  ? Drug use: No  ? Sexual activity: Yes  ?  Birth control/protection: Condom  ?Other Topics Concern  ? Not on file  ?Social History Narrative  ? Not on file  ? ?Social Determinants of Health  ? ?Financial Resource Strain: Not on file  ?Food Insecurity: Not on file  ?Transportation Needs: Not on file  ?Physical Activity: Not on file  ?Stress: Not on file  ?Social Connections: Not on file  ?Intimate Partner Violence: Not on file  ? ? ?Outpatient Medications Prior to Visit  ?Medication Sig Dispense  Refill  ? cetirizine (ZYRTEC) 10 MG tablet Take 1 tablet (10 mg total) by mouth daily. 30 tablet 0  ? famotidine (PEPCID) 20 MG tablet Take 1 tablet (20 mg total) by mouth 2 (two) times daily. 60 tablet 0  ? Multiple Vitamins-Minerals (MULTI ADULT GUMMIES PO) Take by mouth.    ? triamcinolone cream (KENALOG) 0.1 % Apply 1 application topically 2 (two) times daily. Use for eczema flareup for no more than 7 days at a time. 30 g 4  ? ?No facility-administered medications prior to visit.  ? ? ?Allergies  ?Allergen Reactions  ? Motrin [Ibuprofen] Swelling  ?  Liquid--make eyes swell.  ?  ? ? ?ROS ?Review of Systems  ?Constitutional:  Negative for chills and fever.  ?HENT: Negative.    ?Eyes: Negative.   ?Respiratory:  Negative for shortness of breath.   ?Cardiovascular:  Negative for chest pain.  ?Gastrointestinal: Negative.   ?Endocrine: Negative.   ?Genitourinary: Negative.   ?Musculoskeletal: Negative.   ?Skin: Negative.   ?Allergic/Immunologic: Negative.   ?Neurological: Negative.   ?Hematological: Negative.   ?Psychiatric/Behavioral: Negative.    ? ?  ?Objective:  ?  ?Physical Exam ?Vitals and nursing note reviewed.  ?Constitutional:   ?   Appearance: Normal appearance.  ?HENT:  ?     Head: Normocephalic and atraumatic.  ?   Right Ear: External ear normal.  ?   Left Ear: External ear normal.  ?   Nose: Nose normal.  ?   Mouth/Throat:  ?   Mouth: Mucous membranes are moist.  ?   Pharynx: Oropharynx is clear.  ?Eyes:  ?   Extraocular Movements: Extraocular movements intact.  ?   Conjunctiva/sclera: Conjunctivae normal.  ?   Pupils: Pupils are equal, round, and reactive to light.  ?Cardiovascular:  ?   Rate and Rhythm: Normal rate and regular rhythm.  ?   Pulses: Normal pulses.  ?   Heart sounds: Normal heart sounds.  ?Pulmonary:  ?   Effort: Pulmonary effort is normal.  ?   Breath sounds: Normal breath sounds.  ?Musculoskeletal:     ?   General: Normal range of motion.  ?   Cervical back: Normal range of motion and neck  supple.  ?Feet:  ?   Comments: Patient had photo of red blisterlike spot on left heel.  Currently resolved ?Skin: ?   General: Skin is warm and dry.  ?Neurological:  ?   General: No focal deficit present.  ?   Mental Status: She is alert and oriented to person, place, and time.  ?Psychiatric:     ?   Mood and Affect: Mood normal.     ?   Behavior: Behavior normal.     ?   Thought Content: Thought content normal.     ?   Judgment: Judgment normal.  ? ? ?BP 124/75 (BP Location: Right Arm, Patient Position: Sitting, Cuff Size: Normal)   Pulse 60   Temp 98.6 ?F (37 ?C) (Oral)   Resp 18   Ht 5' 2" (1.575 m)   Wt 199 lb (90.3 kg)   LMP 07/16/2021   SpO2 98%   Breastfeeding No   BMI 36.40 kg/m?  ?Wt Readings from Last 3 Encounters:  ?08/07/21 199 lb (90.3 kg)  ?12/20/20 191 lb (86.6 kg)  ?11/19/19 181 lb 12.8 oz (82.5 kg)  ? ? ? ?Health Maintenance Due  ?Topic Date Due  ? COVID-19 Vaccine (1) Never done  ? TETANUS/TDAP  Never done  ? ? ?There are no preventive care reminders to display for this patient. ? ?No results found for: TSH ?Lab Results  ?Component Value Date  ? WBC 7.0 12/20/2020  ? HGB 13.7 12/20/2020  ? HCT 41.1 12/20/2020  ? MCV 81 12/20/2020  ? PLT 267 12/20/2020  ? ?Lab Results  ?Component Value Date  ? NA 139 12/20/2020  ? K 4.4 12/20/2020  ? CO2 22 12/20/2020  ? GLUCOSE 82 12/20/2020  ? BUN 11 12/20/2020  ? CREATININE 0.79 12/20/2020  ? BILITOT <0.2 12/20/2020  ? ALKPHOS 58 12/20/2020  ? AST 23 12/20/2020  ? ALT 12 12/20/2020  ? PROT 7.8 12/20/2020  ? ALBUMIN 4.7 12/20/2020  ? CALCIUM 9.3 12/20/2020  ? EGFR 104 12/20/2020  ? ?Lab Results  ?Component Value Date  ? CHOL 235 (H) 12/20/2020  ? ?Lab Results  ?Component Value Date  ? HDL 63 12/20/2020  ? ?Lab Results  ?Component Value Date  ? LDLCALC 153 (H) 12/20/2020  ? ?Lab Results  ?Component Value Date  ? TRIG 110 12/20/2020  ? ?Lab Results  ?Component Value Date  ? CHOLHDL 3.7 12/20/2020  ? ?No results found for: HGBA1C ? ?  ?Assessment & Plan:   ? ?Problem List Items Addressed This Visit   ?None ?Visit Diagnoses   ? ?  Blood blister    -  Primary  ? ?  ? ? ?No orders of the defined types were placed in this encounter. ?1. Blood blister ?Currently resolved.  Patient education given on supportive care.  Red flags for prompt reevaluation. ? ? ?I have reviewed the patient's medical history (PMH, PSH, Social History, Family History, Medications, and allergies) , and have been updated if relevant. I spent 20 minutes reviewing chart and  face to face time with patient. ? ? ? ? ?Follow-up: Return if symptoms worsen or fail to improve.  ? ? ?Cari S Mayers, PA-C ?

## 2021-11-14 ENCOUNTER — Ambulatory Visit: Payer: Self-pay | Admitting: *Deleted

## 2021-11-14 NOTE — Telephone Encounter (Signed)
  Chief Complaint: chest discomfort requesting appt. Symptoms: chest discomfort when breathing in and out comes and goes x few weeks. Last a few seconds to a minute at times. Dry cough and runny nose x 1 week. Blood noted when blowing nose Frequency: x 1 week and longer for chest discomfort Pertinent Negatives: Patient denies chest pain now no difficulty breathing Disposition: [] ED /[] Urgent Care (no appt availability in office) / [] Appointment(In office/virtual)/ []  [place] [facility]/ [] Home Care/ [] Refused Recommended Disposition /[x]  Mobile Bus/ []  Follow-up with PCP Additional Notes:   No available OV. Recommended Mobile Bus today .     Reason for Disposition  [1] Chest pain lasts < 5 minutes AND [2] NO chest pain or cardiac symptoms (e.g., breathing difficulty, sweating) now (Exception: chest pains that last only a few seconds)  Answer Assessment - Initial Assessment Questions 1. LOCATION: "Where does it hurt?"       Middle of chest  2. RADIATION: "Does the pain go anywhere else?" (e.g., into neck, jaw, arms, back)     No  3. ONSET: "When did the chest pain begin?" (Minutes, hours or days)      Couple of weeks  4. PATTERN "Does the pain come and go, or has it been constant since it started?"  "Does it get worse with exertion?"      Comes and goes  5. DURATION: "How long does it last" (e.g., seconds, minutes, hours)     Few seconds to a minute 6. SEVERITY: "How bad is the pain?"  (e.g., Scale 1-10; mild, moderate, or severe)    - MILD (1-3): doesn't interfere with normal activities     - MODERATE (4-7): interferes with normal activities or awakens from sleep    - SEVERE (8-10): excruciating pain, unable to do any normal activities       Mild  7. CARDIAC RISK FACTORS: "Do you have any history of heart problems or risk factors for heart disease?" (e.g., angina, prior heart attack; diabetes, high blood pressure, high cholesterol, smoker, or strong family history of  heart disease)     Family hx  8. PULMONARY RISK FACTORS: "Do you have any history of lung disease?"  (e.g., blood clots in lung, asthma, emphysema, birth control pills)     na 9. CAUSE: "What do you think is causing the chest pain?"     Not sure  10. OTHER SYMPTOMS: "Do you have any other symptoms?" (e.g., dizziness, nausea, vomiting, sweating, fever, difficulty breathing, cough)       Dry cough , runny nose,  11. PREGNANCY: "Is there any chance you are pregnant?" "When was your last menstrual period?"       na  Protocols used: Chest Pain-A-AH

## 2021-11-16 ENCOUNTER — Ambulatory Visit: Payer: Federal, State, Local not specified - PPO | Admitting: Physician Assistant

## 2021-11-16 ENCOUNTER — Encounter: Payer: Self-pay | Admitting: Physician Assistant

## 2021-11-16 VITALS — BP 125/74 | HR 75 | Ht 65.0 in | Wt 199.0 lb

## 2021-11-16 DIAGNOSIS — E6609 Other obesity due to excess calories: Secondary | ICD-10-CM | POA: Diagnosis not present

## 2021-11-16 DIAGNOSIS — M722 Plantar fascial fibromatosis: Secondary | ICD-10-CM | POA: Diagnosis not present

## 2021-11-16 DIAGNOSIS — Z6833 Body mass index (BMI) 33.0-33.9, adult: Secondary | ICD-10-CM

## 2021-11-16 DIAGNOSIS — L309 Dermatitis, unspecified: Secondary | ICD-10-CM | POA: Diagnosis not present

## 2021-11-16 DIAGNOSIS — J209 Acute bronchitis, unspecified: Secondary | ICD-10-CM | POA: Diagnosis not present

## 2021-11-16 MED ORDER — PREDNISONE 20 MG PO TABS: ORAL_TABLET | ORAL | 0 refills | Status: AC

## 2021-11-16 MED ORDER — CETIRIZINE HCL 10 MG PO TABS: 10.0000 mg | ORAL_TABLET | Freq: Every day | ORAL | 11 refills | Status: AC

## 2021-11-16 MED ORDER — TRIAMCINOLONE ACETONIDE 0.1 % EX CREA: 1.0000 | TOPICAL_CREAM | Freq: Two times a day (BID) | CUTANEOUS | 4 refills | Status: AC

## 2021-11-16 NOTE — Patient Instructions (Signed)
To help with your cough and congestion as well as your Planter fasciitis, you are going to do a short steroid taper.  I also encourage you to start taking Zyrtec on a daily basis.  I encourage you to wear arch supports, roll your arches on frozen water bottles as we discussed and begin a good stretching routine to avoid future flareups.  I hope that you feel better soon, please let us know if there is anything else we can do for you  Roney Jaffe, PA-C Physician Assistant [facility] [email or web address]   Plantar Fasciitis  Plantar fasciitis is a painful foot condition that affects the heel. It occurs when the band of tissue that connects the toes to the heel bone (plantar fascia) becomes irritated. This can happen as the result of exercising too much or doing other repetitive activities (overuse injury). Plantar fasciitis can cause mild irritation to severe pain that makes it difficult to walk or move. The pain is usually worse in the morning after sleeping, or after sitting or lying down for a period of time. Pain may also be worse after long periods of walking or standing. What are the causes? This condition may be caused by: Standing for long periods of time. Wearing shoes that do not have good arch support. Doing activities that put stress on joints (high-impact activities). This includes ballet and exercise that makes your heart beat faster (aerobic exercise), such as running. Being overweight. An abnormal way of walking (gait). Tight muscles in the back of your lower leg (calf). High arches in your feet or flat feet. Starting a new athletic activity. What are the signs or symptoms? The main symptom of this condition is heel pain. Pain may get worse after the following: Taking the first steps after a time of rest, especially in the morning after awakening, or after you have been sitting or lying down for a while. Long  periods of standing still. Pain may decrease after 30-45 minutes of activity, such as gentle walking. How is this diagnosed? This condition may be diagnosed based on your medical history, a physical exam, and your symptoms. Your health care provider will check for: A tender area on the bottom of your foot. A high arch in your foot or flat feet. Pain when you move your foot. Difficulty moving your foot. You may have imaging tests to confirm the diagnosis, such as: X-rays. Ultrasound. MRI. How is this treated? Treatment for plantar fasciitis depends on how severe your condition is. Treatment may include: Rest, ice, pressure (compression), and raising (elevating) the affected foot. This is called RICE therapy. Your health care provider may recommend RICE therapy along with over-the-counter pain medicines to manage your pain. Exercises to stretch your calves and your plantar fascia. A splint that holds your foot in a stretched, upward position while you sleep (night splint). Physical therapy to relieve symptoms and prevent problems in the future. Injections of steroid medicine (cortisone) to relieve pain and inflammation. Stimulating your plantar fascia with electrical impulses (extracorporeal shock wave therapy). This is usually the last treatment option before surgery. Surgery, if other treatments have not worked after 12 months. Follow these instructions at home: Managing pain, stiffness, and swelling  If directed, put ice on the painful area. To do this: Put ice in a plastic bag, or use a frozen bottle of water. Place a towel between your skin and the bag or bottle. Roll the bottom of your foot over the bag or bottle. Do  this for 20 minutes, 2-3 times a day. Wear athletic shoes that have air-sole or gel-sole cushions, or try soft shoe inserts that are designed for plantar fasciitis. Elevate your foot above the level of your heart while you are sitting or lying down. Activity Avoid  activities that cause pain. Ask your health care provider what activities are safe for you. Do physical therapy exercises and stretches as told by your health care provider. Try activities and forms of exercise that are easier on your joints (low impact). Examples include swimming, water aerobics, and biking. General instructions Take over-the-counter and prescription medicines only as told by your health care provider. Wear a night splint while sleeping, if told by your health care provider. Loosen the splint if your toes tingle, become numb, or turn cold and blue. Maintain a healthy weight, or work with your health care provider to lose weight as needed. Keep all follow-up visits. This is important. Contact a health care provider if you have: Symptoms that do not go away with home treatment. Pain that gets worse. Pain that affects your ability to move or do daily activities. Summary Plantar fasciitis is a painful foot condition that affects the heel. It occurs when the band of tissue that connects the toes to the heel bone (plantar fascia) becomes irritated. Heel pain is the main symptom of this condition. It may get worse after exercising too much or standing still for a long time. Treatment varies, but it usually starts with rest, ice, pressure (compression), and raising (elevating) the affected foot. This is called RICE therapy. Over-the-counter medicines can also be used to manage pain. This information is not intended to replace advice given to you by your health care provider. Make sure you discuss any questions you have with your health care provider. Document Revised: 08/10/2019 Document Reviewed: 08/10/2019 Elsevier Patient Education  2023 [employer]  Acute Bronchitis, Adult  Acute bronchitis is sudden inflammation of the main airways (bronchi) that come off the windpipe (trachea) in the lungs. The swelling causes the airways to get smaller and make more mucus than normal. This  can make it hard to breathe and can cause coughing or noisy breathing (wheezing). Acute bronchitis may last several weeks. The cough may last longer. Allergies, asthma, and exposure to smoke may make the condition worse. What are the causes? This condition can be caused by germs and by substances that irritate the lungs, including: Cold and flu viruses. The most common cause of this condition is the virus that causes the common cold. Bacteria. This is less common. Breathing in substances that irritate the lungs, including: Smoke from cigarettes and other forms of tobacco. Dust and pollen. Fumes from household cleaning products, gases, or burned fuel. Indoor or outdoor air pollution. What increases the risk? The following factors may make you more likely to develop this condition: A weak body's defense system, also called the immune system. A condition that affects your lungs and breathing, such as asthma. What are the signs or symptoms? Common symptoms of this condition include: Coughing. This may bring up clear, yellow, or green mucus from your lungs (sputum). Wheezing. Runny or stuffy nose. Having too much mucus in your lungs (chest congestion). Shortness of breath. Aches and pains, including sore throat or chest. How is this diagnosed? This condition is usually diagnosed based on: Your symptoms and medical history. A physical exam. You may also have other tests, including tests to rule out other conditions, such as pneumonia. These tests include: A  test of lung function. Test of a mucus sample to look for the presence of bacteria. Tests to check the oxygen level in your blood. Blood tests. Chest X-ray. How is this treated? Most cases of acute bronchitis clear up over time without treatment. Your health care provider may recommend: Drinking more fluids to help thin your mucus so it is easier to cough up. Taking inhaled medicine (inhaler) to improve air flow in and out of your  lungs. Using a vaporizer or a humidifier. These are machines that add water to the air to help you breathe better. Taking a medicine that thins mucus and clears congestion (expectorant). Taking a medicine that prevents or stops coughing (cough suppressant). It is notcommon to take an antibiotic medicine for this condition. Follow these instructions at home:  Take over-the-counter and prescription medicines only as told by your health care provider. Use an inhaler, vaporizer, or humidifier as told by your health care provider. Take two teaspoons (10 mL) of honey at bedtime to lessen coughing at night. Drink enough fluid to keep your urine pale yellow. Do not use any products that contain nicotine or tobacco. These products include cigarettes, chewing tobacco, and vaping devices, such as e-cigarettes. If you need help quitting, ask your health care provider. Get plenty of rest. Return to your normal activities as told by your health care provider. Ask your health care provider what activities are safe for you. Keep all follow-up visits. This is important. How is this prevented? To lower your risk of getting this condition again: Wash your hands often with soap and water for at least 20 seconds. If soap and water are not available, use hand sanitizer. Avoid contact with people who have cold symptoms. Try not to touch your mouth, nose, or eyes with your hands. Avoid breathing in smoke or chemical fumes. Breathing smoke or chemical fumes will make your condition worse. Get the flu shot every year. Contact a health care provider if: Your symptoms do not improve after 2 weeks. You have trouble coughing up the mucus. Your cough keeps you awake at night. You have a fever. Get help right away if you: Cough up blood. Feel pain in your chest. Have severe shortness of breath. Faint or keep feeling like you are going to faint. Have a severe headache. Have a fever or chills that get worse. These  symptoms may represent a serious problem that is an emergency. Do not wait to see if the symptoms will go away. Get medical help right away. Call your local emergency services (911 in the [place] Do not drive yourself to the hospital. Summary Acute bronchitis is inflammation of the main airways (bronchi) that come off the windpipe (trachea) in the lungs. The swelling causes the airways to get smaller and make more mucus than normal. Drinking more fluids can help thin your mucus so it is easier to cough up. Take over-the-counter and prescription medicines only as told by your health care provider. Do not use any products that contain nicotine or tobacco. These products include cigarettes, chewing tobacco, and vaping devices, such as e-cigarettes. If you need help quitting, ask your health care provider. Contact a health care provider if your symptoms do not improve after 2 weeks. This information is not intended to replace advice given to you by your health care provider. Make sure you discuss any questions you have with your health care provider. Document Revised: 08/24/2020 Document Reviewed: 08/24/2020 Elsevier Patient Education  2023 [employer].

## 2021-11-16 NOTE — Progress Notes (Signed)
Established Patient Office Visit  Subjective   Patient ID: Hannah Andrews, female    DOB: [birth year]  Age: 31 y.o. MRN: [id number]  Chief Complaint  Patient presents with   Foot Injury    Recent blood blisters. Pain down the middle of the foot.     States that she has been experiencing pain in the bottom of her left foot near her arch for the past few weeks.  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  States that she has not tried anything for relief.  States that she has also been having a dry cough with chest congestion for the past few weeks.  States that she started having a runny nose with clear congestion 1 week ago, does endorse occasional blood tinge to the discharge.  States that she has tried Mucinex and cough drops without relief.  Denies sinus pain, but does endorse headaches in the last few weeks.  Is unsure of sick contacts.      History reviewed. No pertinent past medical history. Social History   Socioeconomic History   Marital status: Single    Spouse name: Not on file   Number of children: Not on file   Years of education: Not on file   Highest education level: Not on file  Occupational History   Not on file  Tobacco Use   Smoking status: Never   Smokeless tobacco: Never  Vaping Use   Vaping Use: Never used  Substance and Sexual Activity   Alcohol use: No   Drug use: No   Sexual activity: Yes    Birth control/protection: Condom  Other Topics Concern   Not on file  Social History Narrative   Not on file   Social Determinants of Health   Financial Resource Strain: Not on file  Food Insecurity: Not on file  Transportation Needs: Not on file  Physical Activity: Not on file  Stress: Not on file  Social Connections: Not on file  Intimate Partner Violence: Not on file   Family History  Problem Relation Age of Onset    Diabetes Maternal Grandmother    Diabetes Maternal Grandfather    Allergies  Allergen Reactions   Motrin [Ibuprofen] Swelling    Liquid--make eyes swell.      Review of Systems  Constitutional:  Negative for chills and fever.  HENT:  Positive for congestion. Negative for ear discharge, ear pain, hearing loss, sinus pain and sore throat.   Eyes: Negative.   Respiratory:  Positive for cough and shortness of breath. Negative for sputum production and wheezing.   Cardiovascular:  Negative for chest pain.  Gastrointestinal: Negative.   Genitourinary: Negative.   Musculoskeletal: Negative.   Skin: Negative.   Neurological: Negative.   Endo/Heme/Allergies: Negative.   Psychiatric/Behavioral: Negative.        Objective:     BP 125/74 (BP Location: Left Arm, Patient Position: Sitting, Cuff Size: Normal)   Pulse 75   Ht 5\' 5"  (1.651 m)   Wt 199 lb (90.3 kg)   LMP 11/04/2021   SpO2 100%   BMI 33.12 kg/m    Physical Exam Vitals and nursing note reviewed.  Constitutional:      Appearance: Normal appearance.  HENT:     Head: Normocephalic and atraumatic.     Salivary Glands: Right salivary gland is not diffusely enlarged or  tender. Left salivary gland is not diffusely enlarged or tender.     Right Ear: Tympanic membrane, ear canal and external ear normal.     Left Ear: Tympanic membrane, ear canal and external ear normal.     Nose:     Right Turbinates: Enlarged and swollen.     Left Turbinates: Enlarged and swollen.     Right Sinus: No maxillary sinus tenderness or frontal sinus tenderness.     Left Sinus: No maxillary sinus tenderness or frontal sinus tenderness.     Mouth/Throat:     Lips: Pink.     Mouth: Mucous membranes are moist.     Pharynx: Oropharynx is clear.     Tonsils: 0 on the right. 0 on the left.  Cardiovascular:     Rate and Rhythm: Normal rate and regular rhythm.     Pulses: Normal pulses.          Dorsalis pedis pulses are 2+ on the right side and 2+  on the left side.       Posterior tibial pulses are 2+ on the right side and 2+ on the left side.     Heart sounds: Normal heart sounds.  Pulmonary:     Effort: Pulmonary effort is normal.     Breath sounds: Normal breath sounds. No wheezing.  Musculoskeletal:        General: Normal range of motion.     Cervical back: Normal range of motion and neck supple.       Feet:  Feet:     Comments: Left arch tender to palpation Skin:    General: Skin is warm and dry.  Neurological:     General: No focal deficit present.     Mental Status: She is alert and oriented to person, place, and time.  Psychiatric:        Mood and Affect: Mood normal.        Behavior: Behavior normal.        Thought Content: Thought content normal.        Judgment: Judgment normal.        Assessment & Plan:   Problem List Items Addressed This Visit   None Visit Diagnoses     Acute bronchitis, unspecified organism    -  Primary   Relevant Medications   cetirizine (ZYRTEC) 10 MG tablet   predniSONE (DELTASONE) 20 MG tablet   Plantar fasciitis of left foot       Eczema, unspecified type       Relevant Medications   triamcinolone cream (KENALOG) 0.1 %   Class 1 obesity due to excess calories without serious comorbidity with body mass index (BMI) of 33.0 to 33.9 in adult          1. Plantar fasciitis of left foot Patient education given on supportive care.  Red flags given for prompt reevaluation  2. Acute bronchitis, unspecified organism Trial of Zyrtec, prednisone taper.  Patient education given on supportive care, red flags for prompt reevaluation. - cetirizine (ZYRTEC) 10 MG tablet; Take 1 tablet (10 mg total) by mouth daily.  Dispense: 30 tablet; Refill: 11 - predniSONE (DELTASONE) 20 MG tablet; Take 3 tablets (60 mg total) by mouth daily with breakfast for 2 days, THEN 2 tablets (40 mg total) daily with breakfast for 2 days, THEN 1 tablet (20 mg total) daily with breakfast for 2 days, THEN 0.5  tablets (10 mg total) daily with breakfast for 2 days.  Dispense: 13 tablet; Refill:  0  3. Eczema, unspecified type Refill provided per patient request - triamcinolone cream (KENALOG) 0.1 %; Apply 1 Application topically 2 (two) times daily. Use for eczema flareup for no more than 7 days at a time.  Dispense: 30 g; Refill: 4  4. Class 1 obesity due to excess calories without serious comorbidity with body mass index (BMI) of 33.0 to 33.9 in adult  I have reviewed the patient's medical history (PMH, PSH, Social History, Family History, Medications, and allergies) , and have been updated if relevant. I spent 30 minutes reviewing chart and  face to face time with patient.    Return if symptoms worsen or fail to improve.    Loraine Grip Mayers, PA-C

## 2021-11-17 ENCOUNTER — Ambulatory Visit: Payer: Self-pay | Admitting: *Deleted

## 2021-11-17 NOTE — Telephone Encounter (Signed)
Summary: Swollen Feet with pain   Pt was seen at the mobile clinic yesterday. Pt reporting that her L & R foot is still swollen to the point she is unable to put a shoe one. Pt is also reporting pain. Please advise     Message left for pt to return call or we would call her again in 30 minutes.

## 2021-11-17 NOTE — Telephone Encounter (Signed)
  Chief Complaint: Foot pain and swelling Symptoms: ibid Frequency: 2 days ago Pertinent Negatives: Patient denies fever SOB, chest pain Disposition: [] ED /[x] Urgent Care (no appt availability in office) / [] Appointment(In office/virtual)/ []  [place] [facility]/ [] Home Care/ [] Refused Recommended Disposition /[] [place] [facility]/ []  Follow-up with PCP Additional Notes: pt was seen at [facility] yesterday for foot pain, but no mention of swelling. Pt will return to UC for foot swelling.   Pt would like updated note to stay out of work today.     Summary: Swollen Feet with pain   Pt was seen at the mobile clinic yesterday. Pt reporting that her L & R foot is still swollen to the point she is unable to put a shoe one. Pt is also reporting pain. Please advise      Reason for Disposition  [1] MODERATE pain (e.g., interferes with normal activities, limping) AND [2] present > 3 days  [1] Very swollen ankle AND [2] no fever  Answer Assessment - Initial Assessment Questions 1. ONSET: "When did the pain start?"      2 days ago 2. LOCATION: "Where is the pain located?"      Both feet in arch and heel 3. PAIN: "How bad is the pain?"    (Scale 1-10; or mild, moderate, severe)  - MILD (1-3): doesn't interfere with normal activities.   - MODERATE (4-7): interferes with normal activities (e.g., work or school) or awakens from sleep, limping.   - SEVERE (8-10): excruciating pain, unable to do any normal activities, unable to walk.      5/10 4. WORK OR EXERCISE: "Has there been any recent work or exercise that involved this part of the body?"      Walking at work - hard concrete floors 5. CAUSE: "What do you think is causing the foot pain?"     Overuse from walking on hard floors 6. OTHER SYMPTOMS: "Do you have any other symptoms?" (e.g., leg pain, rash, fever, numbness)     no 7. PREGNANCY: "Is there any chance you are pregnant?" "When was your last menstrual period?"     no  Answer  Assessment - Initial Assessment Questions 1. LOCATION: "Which ankle is swollen?" "Where is the swelling?"     Both feet 2. ONSET: "When did the swelling start?"     A few days ago 3. SIZE: "How large is the swelling?"     moderate 4. PAIN: "Is there any pain?" If Yes, ask: "How bad is it?" (Scale 1-10; or mild, moderate, severe)   - NONE (0): no pain.   - MILD (1-3): doesn't interfere with normal activities.    - MODERATE (4-7): interferes with normal activities (e.g., work or school) or awakens from sleep, limping.    - SEVERE (8-10): excruciating pain, unable to do any normal activities, unable to walk.      5/10 5. CAUSE: "What do you think caused the ankle swelling?"     Unsure - ovresue 6. OTHER SYMPTOMS: "Do you have any other symptoms?" (e.g., fever, chest pain, difficulty breathing, calf pain)     no 7. PREGNANCY: "Is there any chance you are pregnant?" "When was your last menstrual period?"     no  Protocols used: Foot Pain-A-AH, Ankle Swelling-A-AH

## 2022-03-07 ENCOUNTER — Ambulatory Visit: Payer: Self-pay | Admitting: *Deleted

## 2022-03-07 DIAGNOSIS — M791 Myalgia, unspecified site: Secondary | ICD-10-CM | POA: Diagnosis not present

## 2022-03-07 DIAGNOSIS — R051 Acute cough: Secondary | ICD-10-CM | POA: Diagnosis not present

## 2022-03-07 DIAGNOSIS — R0982 Postnasal drip: Secondary | ICD-10-CM | POA: Diagnosis not present

## 2022-03-07 NOTE — Telephone Encounter (Signed)
Summary: dry cough   Pt has dry cough, body aches, drainage, mucus going down throat.  Wanted appt today. No appts, please advise      Reason for Disposition  [1] Pus on tonsils (back of throat) AND [2]  fever AND [3] swollen neck lymph nodes ("glands")  Answer Assessment - Initial Assessment Questions 1. ONSET: "When did the throat start hurting?" (Hours or days ago)      Monday 2. SEVERITY: "How bad is the sore throat?" (Scale 1-10; mild, moderate or severe)   - MILD (1-3):  Doesn't interfere with eating or normal activities.   - MODERATE (4-7): Interferes with eating some solids and normal activities.   - SEVERE (8-10):  Excruciating pain, interferes with most normal activities.   - SEVERE WITH DYSPHAGIA (10): Can't swallow liquids, drooling.     Mild/moderate 3. STREP EXPOSURE: "Has there been any exposure to strep within the past week?" If Yes, ask: "What type of contact occurred?"      no 4.  VIRAL SYMPTOMS: "Are there any symptoms of a cold, such as a runny nose, cough, hoarse voice or red eyes?"      Sinus drainage, cough 5. FEVER: "Do you have a fever?" If Yes, ask: "What is your temperature, how was it measured, and when did it start?"     No- body aches 6. PUS ON THE TONSILS: "Is there pus on the tonsils in the back of your throat?"     yes 7. OTHER SYMPTOMS: "Do you have any other symptoms?" (e.g., difficulty breathing, headache, rash)     Body ache 8. PREGNANCY: "Is there any chance you are pregnant?" "When was your last menstrual period?"  Protocols used: Sore Throat-A-AH

## 2022-03-07 NOTE — Telephone Encounter (Signed)
PCP aware

## 2022-03-07 NOTE — Telephone Encounter (Signed)
  Chief Complaint: sore throat Symptoms: sore throat, cough, body aches Frequency: stated Monday Pertinent Negatives: Patient denies fever Disposition: [] ED /[x] Urgent Care (no appt availability in office) / [] Appointment(In office/virtual)/ []  [facility]/ [] Home Care/ [] Refused Recommended Disposition /[] [facility]/ []  Follow-up with PCP Additional Notes: Patient advised no open appointment- UC/mobile unit options given

## 2022-04-10 NOTE — Progress Notes (Deleted)
Patient ID: Hannah Andrews, female    DOB: 1990/10/20  MRN: [id number]  CC: Annual Physical Exam  Subjective: Hannah Andrews is a 31 y.o. female who presents for annual physical exam.   Her concerns today include: ***  Patient Active Problem List   Diagnosis Date Noted   Rash 03/01/2021     Current Outpatient Medications on File Prior to Visit  Medication Sig Dispense Refill   cetirizine (ZYRTEC) 10 MG tablet Take 1 tablet (10 mg total) by mouth daily. 30 tablet 11   famotidine (PEPCID) 20 MG tablet Take 1 tablet (20 mg total) by mouth 2 (two) times daily. 60 tablet 0   Multiple Vitamins-Minerals (MULTI ADULT GUMMIES PO) Take by mouth.     triamcinolone cream (KENALOG) 0.1 % Apply 1 Application topically 2 (two) times daily. Use for eczema flareup for no more than 7 days at a time. 30 g 4   No current facility-administered medications on file prior to visit.    Allergies  Allergen Reactions   Motrin [Ibuprofen] Swelling    Liquid--make eyes swell.      Social History   Socioeconomic History   Marital status: Single    Spouse name: Not on file   Number of children: Not on file   Years of education: Not on file   Highest education level: Not on file  Occupational History   Not on file  Tobacco Use   Smoking status: Never   Smokeless tobacco: Never  Vaping Use   Vaping Use: Never used  Substance and Sexual Activity   Alcohol use: No   Drug use: No   Sexual activity: Yes    Birth control/protection: Condom  Other Topics Concern   Not on file  Social History Narrative   Not on file   Social Determinants of Health   Financial Resource Strain: Not on file  Food Insecurity: Not on file  Transportation Needs: Not on file  Physical Activity: Not on file  Stress: Not on file  Social Connections: Not on file  Intimate Partner Violence: Not on file    Family History  Problem Relation Age of Onset   Diabetes Maternal Grandmother    Diabetes Maternal  Grandfather     Past Surgical History:  Procedure Laterality Date   APPENDECTOMY      ROS: Review of Systems Negative except as stated above  PHYSICAL EXAM: There were no vitals taken for this visit.  Physical Exam  {female adult master:310786} {female adult master:310785}     Latest Ref Rng & Units 12/20/2020    2:38 PM 11/16/2019    9:03 AM 09/04/2018   11:48 AM  CMP  Glucose 65 - 99 mg/dL 82  85  90   BUN 6 - 20 mg/dL 11  6  9    Creatinine 0.57 - 1.00 mg/dL  9.92  4.26   Sodium 134 - 144 mmol/L 139  132  140   Potassium 3.5 - 5.2 mmol/L 4.4  4.1  3.8   Chloride 96 - 106 mmol/L 104  99  107   CO2 20 - 29 mmol/L 22  19  23    Calcium 8.7 - 10.2 mg/dL 9.3  9.3  8.7   Total Protein 6.0 - 8.5 g/dL 7.8  7.5  6.8   Total Bilirubin 0.0 - 1.2 mg/dL 8.34  0.3  0.2   Alkaline Phos 44 - 121 IU/L 58  50  56   AST 0 - 40 IU/L  23  13  18    ALT 0 - 32 IU/L 12  12  11     Lipid Panel     Component Value Date/Time   CHOL 235 (H) 12/20/2020 1438   TRIG 110 12/20/2020 1438   HDL 63 12/20/2020 1438   CHOLHDL 3.7 12/20/2020 1438   LDLCALC 153 (H) 12/20/2020 1438    CBC    Component Value Date/Time   WBC 7.0 12/20/2020 1438   RBC 5.08 12/20/2020 1438   HGB 13.7 12/20/2020 1438   HCT 41.1 12/20/2020 1438   PLT 267 12/20/2020 1438   MCV 81 12/20/2020 1438   MCH 27.0 12/20/2020 1438   MCHC 33.3 12/20/2020 1438   RDW 12.9 12/20/2020 1438   LYMPHSABS 2.7 12/20/2020 1438   EOSABS 0.2 12/20/2020 1438   BASOSABS 0.0 12/20/2020 1438    ASSESSMENT AND PLAN:  There are no diagnoses linked to this encounter.   Patient was given the opportunity to ask questions.  Patient verbalized understanding of the plan and was able to repeat key elements of the plan. Patient was given clear instructions to go to Emergency Department or return to medical center if symptoms don't improve, worsen, or new problems develop.The patient verbalized understanding.   No orders of the defined types  were placed in this encounter.    Requested Prescriptions    No prescriptions requested or ordered in this encounter    No follow-ups on file.  12/22/2020, NP

## 2022-04-20 ENCOUNTER — Encounter: Payer: Federal, State, Local not specified - PPO | Admitting: Family

## 2022-04-20 DIAGNOSIS — Z0000 Encounter for general adult medical examination without abnormal findings: Secondary | ICD-10-CM

## 2022-04-20 DIAGNOSIS — Z13220 Encounter for screening for lipoid disorders: Secondary | ICD-10-CM

## 2022-04-20 DIAGNOSIS — Z130 Encounter for screening for diseases of the blood and blood-forming organs and certain disorders involving the immune mechanism: Secondary | ICD-10-CM

## 2022-04-20 DIAGNOSIS — Z13228 Encounter for screening for other metabolic disorders: Secondary | ICD-10-CM

## 2022-04-20 DIAGNOSIS — Z131 Encounter for screening for diabetes mellitus: Secondary | ICD-10-CM

## 2022-04-20 DIAGNOSIS — Z1329 Encounter for screening for other suspected endocrine disorder: Secondary | ICD-10-CM

## 2022-05-08 ENCOUNTER — Ambulatory Visit: Payer: Self-pay | Admitting: *Deleted

## 2022-05-08 ENCOUNTER — Ambulatory Visit: Payer: Federal, State, Local not specified - PPO | Admitting: Physician Assistant

## 2022-05-08 ENCOUNTER — Encounter: Payer: Self-pay | Admitting: Physician Assistant

## 2022-05-08 VITALS — BP 124/73 | HR 87 | Ht 62.0 in | Wt 198.0 lb

## 2022-05-08 DIAGNOSIS — J208 Acute bronchitis due to other specified organisms: Secondary | ICD-10-CM | POA: Diagnosis not present

## 2022-05-08 DIAGNOSIS — B9689 Other specified bacterial agents as the cause of diseases classified elsewhere: Secondary | ICD-10-CM

## 2022-05-08 MED ORDER — BENZONATATE 200 MG PO CAPS: 200.0000 mg | ORAL_CAPSULE | Freq: Two times a day (BID) | ORAL | 0 refills | Status: DC | PRN

## 2022-05-08 MED ORDER — PREDNISONE 20 MG PO TABS: ORAL_TABLET | ORAL | 0 refills | Status: AC

## 2022-05-08 MED ORDER — AZITHROMYCIN 250 MG PO TABS: ORAL_TABLET | ORAL | 0 refills | Status: DC

## 2022-05-08 NOTE — Patient Instructions (Signed)
You are going to take azithromycin as directed.  You will also do a short steroid taper as directed.  I sent a prescription for cough medication to your pharmacy, you can use this twice a day.  I encourage you to take the zyrtec on a daily basis as well.  Make sure that you are getting plenty of rest and staying very well-hydrated.  I hope that you feel better soon, please let us know if there is anything else we can do for you.  Kennieth Rad, PA-C Physician Assistant [facility] [email or web address]   Acute Bronchitis, Adult  Acute bronchitis is sudden inflammation of the main airways (bronchi) that come off the windpipe (trachea) in the lungs. The swelling causes the airways to get smaller and make more mucus than normal. This can make it hard to breathe and can cause coughing or noisy breathing (wheezing). Acute bronchitis may last several weeks. The cough may last longer. Allergies, asthma, and exposure to smoke may make the condition worse. What are the causes? This condition can be caused by germs and by substances that irritate the lungs, including: Cold and flu viruses. The most common cause of this condition is the virus that causes the common cold. Bacteria. This is less common. Breathing in substances that irritate the lungs, including: Smoke from cigarettes and other forms of tobacco. Dust and pollen. Fumes from household cleaning products, gases, or burned fuel. Indoor or outdoor air pollution. What increases the risk? The following factors may make you more likely to develop this condition: A weak body's defense system, also called the immune system. A condition that affects your lungs and breathing, such as asthma. What are the signs or symptoms? Common symptoms of this condition include: Coughing. This may bring up clear, yellow, or green mucus from your lungs (sputum). Wheezing. Runny or stuffy nose. Having  too much mucus in your lungs (chest congestion). Shortness of breath. Aches and pains, including sore throat or chest. How is this diagnosed? This condition is usually diagnosed based on: Your symptoms and medical history. A physical exam. You may also have other tests, including tests to rule out other conditions, such as pneumonia. These tests include: A test of lung function. Test of a mucus sample to look for the presence of bacteria. Tests to check the oxygen level in your blood. Blood tests. Chest X-ray. How is this treated? Most cases of acute bronchitis clear up over time without treatment. Your health care provider may recommend: Drinking more fluids to help thin your mucus so it is easier to cough up. Taking inhaled medicine (inhaler) to improve air flow in and out of your lungs. Using a vaporizer or a humidifier. These are machines that add water to the air to help you breathe better. Taking a medicine that thins mucus and clears congestion (expectorant). Taking a medicine that prevents or stops coughing (cough suppressant). It is not common to take an antibiotic medicine for this condition. Follow these instructions at home:  Take over-the-counter and prescription medicines only as told by your health care provider. Use an inhaler, vaporizer, or humidifier as told by your health care provider. Take two teaspoons (10 mL) of honey at bedtime to lessen coughing at night. Drink enough fluid to keep your urine pale yellow. Do not use any products that contain nicotine or tobacco. These products include cigarettes, chewing tobacco, and vaping devices, such as e-cigarettes. If you need help quitting, ask your health care provider. Get plenty of  rest. Return to your normal activities as told by your health care provider. Ask your health care provider what activities are safe for you. Keep all follow-up visits. This is important. How is this prevented? To lower your risk of getting  this condition again: Wash your hands often with soap and water for at least 20 seconds. If soap and water are not available, use hand sanitizer. Avoid contact with people who have cold symptoms. Try not to touch your mouth, nose, or eyes with your hands. Avoid breathing in smoke or chemical fumes. Breathing smoke or chemical fumes will make your condition worse. Get the flu shot every year. Contact a health care provider if: Your symptoms do not improve after 2 weeks. You have trouble coughing up the mucus. Your cough keeps you awake at night. You have a fever. Get help right away if you: Cough up blood. Feel pain in your chest. Have severe shortness of breath. Faint or keep feeling like you are going to faint. Have a severe headache. Have a fever or chills that get worse. These symptoms may represent a serious problem that is an emergency. Do not wait to see if the symptoms will go away. Get medical help right away. Call your local emergency services (911 in the [place] Do not drive yourself to the hospital. Summary Acute bronchitis is inflammation of the main airways (bronchi) that come off the windpipe (trachea) in the lungs. The swelling causes the airways to get smaller and make more mucus than normal. Drinking more fluids can help thin your mucus so it is easier to cough up. Take over-the-counter and prescription medicines only as told by your health care provider. Do not use any products that contain nicotine or tobacco. These products include cigarettes, chewing tobacco, and vaping devices, such as e-cigarettes. If you need help quitting, ask your health care provider. Contact a health care provider if your symptoms do not improve after 2 weeks. This information is not intended to replace advice given to you by your health care provider. Make sure you discuss any questions you have with your health care provider. Document Revised: 08/03/2021 Document Reviewed: 08/24/2020 Elsevier  Patient Education  [place].

## 2022-05-08 NOTE — Telephone Encounter (Signed)
  Chief Complaint: Cough Symptoms: Productive cough, yellowish. Wheezing at times, fever 102.0 yesterday, none presently. Sinus pressure, headache Frequency: "Been going on for a month, seen in UC. Not really any better."  Cough keeps awake at HS. Pertinent Negatives: Patient denies SOB except with coughing Disposition: [] ED /[] Urgent Care (no appt availability in office) / [] Appointment(In office/virtual)/ []  [facility]/ [] Home Care/ [] Refused Recommended Disposition /[x] [facility]/ []  Follow-up with PCP Additional Notes: No availability at practice, advised Mobile [facility], will follow disposition.  Reason for Disposition  Wheezing is present  Answer Assessment - Initial Assessment Questions 1. ONSET: "When did the cough begin?"      04/29/22 2. SEVERITY: "How bad is the cough today?"      Bad spells during day, worse at night 3. SPUTUM: "Describe the color of your sputum" (none, dry cough; clear, white, yellow, green)    yellowish 4. HEMOPTYSIS: "Are you coughing up any blood?" If so ask: "How much?" (flecks, streaks, tablespoons, etc.)     No 5. DIFFICULTY BREATHING: "Are you having difficulty breathing?" If Yes, ask: "How bad is it?" (e.g., mild, moderate, severe)    - MILD: No SOB at rest, mild SOB with walking, speaks normally in sentences, can lie down, no retractions, pulse < 100.    - MODERATE: SOB at rest, SOB with minimal exertion and prefers to sit, cannot lie down flat, speaks in phrases, mild retractions, audible wheezing, pulse 100-120.    - SEVERE: Very SOB at rest, speaks in single words, struggling to breathe, sitting hunched forward, retractions, pulse > 120      With coughing wheezes at times. 6. FEVER: "Do you have a fever?" If Yes, ask: "What is your temperature, how was it measured, and when did it start?"     102.0 yesterday. Not presently but has chills 10. OTHER SYMPTOMS: "Do you have any other symptoms?" (e.g., runny nose, wheezing, chest  pain)       Sinus pressure, sore throat, not presently  Protocols used: Cough - Acute Productive-A-AH

## 2022-05-08 NOTE — Progress Notes (Signed)
   Established Patient Office Visit  Subjective   Patient ID: Burkley Dech, female    DOB: 1991/01/12  Age: 32 y.o. MRN: [id number]  No chief complaint on file.   Productive cough with yellowsih sputum for the past month  Went to UC - gave flonase  November 1st - tested for covid -  Worse at night - keeping her awake Wheezing in chest - at night   Fever christmas day Mucinex           {History (Optional):23778}  ROS    Objective:     There were no vitals taken for this visit. {Vitals History (Optional):23777}  Physical Exam   No results found for any visits on 05/08/22.  {Labs (Optional):23779}  The ASCVD Risk score (Arnett DK, et al., 2019) failed to calculate for the following reasons:   The 2019 ASCVD risk score is only valid for ages 63 to 86    Assessment & Plan:   Problem List Items Addressed This Visit   None   No follow-ups on file.    Loraine Grip Mayers, PA-C

## 2022-05-08 NOTE — Telephone Encounter (Signed)
PCP aware

## 2022-05-14 DIAGNOSIS — L509 Urticaria, unspecified: Secondary | ICD-10-CM | POA: Insufficient documentation

## 2022-05-18 NOTE — Progress Notes (Signed)
Patient ID: Hannah Andrews, female    DOB: 1991/02/12  MRN: [id number]  CC: Annual Physical Exam   Subjective: Hannah Andrews is a 32 y.o. female who presents for annual physical exam.   Her concerns today include:  Itching skin of abdomen and bilateral upper/lower extremities. Considered if related to chili beans she recently ate. Using several over-the counter and prescribed creams without relief. She does have a history of eczema. No further issues/concerns for today.   Patient Active Problem List   Diagnosis Date Noted   Hives 05/14/2022   Rash 03/01/2021     Current Outpatient Medications on File Prior to Visit  Medication Sig Dispense Refill   cetirizine (ZYRTEC) 10 MG tablet Take 1 tablet (10 mg total) by mouth daily. (Patient not taking: Reported on 05/08/2022) 30 tablet 11   fluticasone (FLONASE) 50 MCG/ACT nasal spray Place 2 sprays into both nostrils daily.     Multiple Vitamins-Minerals (MULTI ADULT GUMMIES PO) Take by mouth.     triamcinolone cream (KENALOG) 0.1 % Apply 1 Application topically 2 (two) times daily. Use for eczema flareup for no more than 7 days at a time. 30 g 4   No current facility-administered medications on file prior to visit.    Allergies  Allergen Reactions   Motrin [Ibuprofen] Swelling    Liquid--make eyes swell.      Social History   Socioeconomic History   Marital status: Single    Spouse name: Not on file   Number of children: Not on file   Years of education: Not on file   Highest education level: Not on file  Occupational History   Not on file  Tobacco Use   Smoking status: Never    Passive exposure: Never   Smokeless tobacco: Never  Vaping Use   Vaping Use: Never used  Substance and Sexual Activity   Alcohol use: No   Drug use: No   Sexual activity: Yes    Birth control/protection: Condom  Other Topics Concern   Not on file  Social History Narrative   Not on file   Social Determinants of Health   Financial  Resource Strain: Not on file  Food Insecurity: Not on file  Transportation Needs: Not on file  Physical Activity: Not on file  Stress: Not on file  Social Connections: Not on file  Intimate Partner Violence: Not on file    Family History  Problem Relation Age of Onset   Diabetes Maternal Grandmother    Diabetes Maternal Grandfather     Past Surgical History:  Procedure Laterality Date   APPENDECTOMY     WISDOM TOOTH EXTRACTION      ROS: Review of Systems Negative except as stated above  PHYSICAL EXAM: BP 122/81   Pulse 62   Temp 98.3 F (36.8 C)   Resp 16   Wt 199 lb (90.3 kg)   LMP 04/18/2022   SpO2 98%   BMI 36.40 kg/m   Physical Exam HENT:     Head: Normocephalic and atraumatic.     Right Ear: Tympanic membrane, ear canal and external ear normal.     Left Ear: Tympanic membrane, ear canal and external ear normal.     Nose: Nose normal.     Mouth/Throat:     Mouth: Mucous membranes are moist.     Pharynx: Oropharynx is clear.  Eyes:     Extraocular Movements: Extraocular movements intact.     Conjunctiva/sclera: Conjunctivae normal.  Pupils: Pupils are equal, round, and reactive to light.  Cardiovascular:     Rate and Rhythm: Normal rate and regular rhythm.     Pulses: Normal pulses.     Heart sounds: Normal heart sounds.  Pulmonary:     Effort: Pulmonary effort is normal.     Breath sounds: Normal breath sounds.  Chest:     Comments: Patient declined.  Abdominal:     General: Bowel sounds are normal.     Palpations: Abdomen is soft.  Genitourinary:    Comments: Patient declined.  Musculoskeletal:        General: Normal range of motion.     Right shoulder: Normal.     Left shoulder: Normal.     Right upper arm: Normal.     Left upper arm: Normal.     Right elbow: Normal.     Left elbow: Normal.     Right forearm: Normal.     Left forearm: Normal.     Right wrist: Normal.     Left wrist: Normal.     Right hand: Normal.     Left hand:  Normal.     Cervical back: Normal, normal range of motion and neck supple.     Thoracic back: Normal.     Lumbar back: Normal.     Right hip: Normal.     Left hip: Normal.     Right upper leg: Normal.     Left upper leg: Normal.     Right knee: Normal.     Left knee: Normal.     Right lower leg: Normal.     Left lower leg: Normal.     Right ankle: Normal.     Left ankle: Normal.     Right foot: Normal.     Left foot: Normal.  Skin:    General: Skin is warm and dry.     Capillary Refill: Capillary refill takes less than 2 seconds.     Findings: Rash present.  Neurological:     General: No focal deficit present.     Mental Status: She is alert and oriented to person, place, and time.  Psychiatric:        Mood and Affect: Mood normal.        Behavior: Behavior normal.    ASSESSMENT AND PLAN: 1. Annual physical exam - Counseled on 150 minutes of exercise per week as tolerated, healthy eating (including decreased daily intake of saturated fats, cholesterol, added sugars, sodium), STI prevention, and routine healthcare maintenance.  2. Screening for metabolic disorder - Routine screening.  - CMP14+EGFR  3. Screening for deficiency anemia - Routine screening.  - CBC  4. Diabetes mellitus screening - Routine screening.  - Hemoglobin A1c  5. Screening cholesterol level - Routine screening.  - Lipid panel  6. Thyroid disorder screen - Routine screening.  - TSH  7. Routine screening for STI (sexually transmitted infection) - Routine screening.  - Cervicovaginal ancillary only - POCT URINALYSIS DIP (CLINITEK); Future  8. Encounter for allergy testing - Routine screening.  - Epinephrine injection as prescribed. Counseled on medication adherence.  - Tomato IgE - Allergen, Cheese, Cheddar,f81 - F082-IgE Cheese, Mold Type - Allergen, Kidney Bean, Rf287 - EPINEPHrine (EPIPEN 2-PAK) 0.3 mg/0.3 mL IJ SOAJ injection; Inject 0.3 mg into the muscle as needed for anaphylaxis.   Dispense: 2 each; Refill: 1  9. Rash and nonspecific skin eruption 10. Pruritus 11. Eczema, unspecified type - Hydrocortisone ointment as prescribed. Counseled on  medication adherence/adverse effects.  - Referral to Dermatology for further evaluation/management.  - hydrocortisone 1 % ointment; Apply 1 Application topically 2 (two) times daily.  Dispense: 60 g; Refill: 1 - Ambulatory referral to Dermatology   Patient was given the opportunity to ask questions.  Patient verbalized understanding of the plan and was able to repeat key elements of the plan. Patient was given clear instructions to go to Emergency Department or return to medical center if symptoms don't improve, worsen, or new problems develop.The patient verbalized understanding.   Orders Placed This Encounter  Procedures   CBC   Lipid panel   TSH   CMP14+EGFR   Hemoglobin A1c   Tomato IgE   Allergen, Cheese, Cheddar,f81   F082-IgE Cheese, Mold Type   Allergen, Kidney Bean, Rf287   Ambulatory referral to Dermatology   POCT URINALYSIS DIP (CLINITEK)     Requested Prescriptions   Signed Prescriptions Disp Refills   hydrocortisone 1 % ointment 60 g 1    Sig: Apply 1 Application topically 2 (two) times daily.   EPINEPHrine (EPIPEN 2-PAK) 0.3 mg/0.3 mL IJ SOAJ injection 2 each 1    Sig: Inject 0.3 mg into the muscle as needed for anaphylaxis.    Return in about 1 year (around 05/24/2023) for Physical per patient preference.  Camillia Herter, NP

## 2022-05-23 ENCOUNTER — Other Ambulatory Visit (HOSPITAL_COMMUNITY)
Admission: RE | Admit: 2022-05-23 | Discharge: 2022-05-23 | Disposition: A | Discharge status: Home / Self Care | Payer: Federal, State, Local not specified - PPO | Source: Ambulatory Visit | Attending: Family | Admitting: Family

## 2022-05-23 ENCOUNTER — Encounter: Payer: Self-pay | Admitting: Family

## 2022-05-23 ENCOUNTER — Ambulatory Visit (INDEPENDENT_AMBULATORY_CARE_PROVIDER_SITE_OTHER): Payer: Federal, State, Local not specified - PPO | Admitting: Family

## 2022-05-23 VITALS — BP 122/81 | HR 62 | Temp 98.3°F | Resp 16 | Ht 62.01 in | Wt 199.0 lb

## 2022-05-23 DIAGNOSIS — L299 Pruritus, unspecified: Secondary | ICD-10-CM

## 2022-05-23 DIAGNOSIS — Z1329 Encounter for screening for other suspected endocrine disorder: Secondary | ICD-10-CM | POA: Diagnosis not present

## 2022-05-23 DIAGNOSIS — Z130 Encounter for screening for diseases of the blood and blood-forming organs and certain disorders involving the immune mechanism: Secondary | ICD-10-CM

## 2022-05-23 DIAGNOSIS — Z0182 Encounter for allergy testing: Secondary | ICD-10-CM

## 2022-05-23 DIAGNOSIS — Z13228 Encounter for screening for other metabolic disorders: Secondary | ICD-10-CM | POA: Diagnosis not present

## 2022-05-23 DIAGNOSIS — L309 Dermatitis, unspecified: Secondary | ICD-10-CM

## 2022-05-23 DIAGNOSIS — Z0000 Encounter for general adult medical examination without abnormal findings: Secondary | ICD-10-CM

## 2022-05-23 DIAGNOSIS — Z113 Encounter for screening for infections with a predominantly sexual mode of transmission: Secondary | ICD-10-CM | POA: Insufficient documentation

## 2022-05-23 DIAGNOSIS — Z13220 Encounter for screening for lipoid disorders: Secondary | ICD-10-CM

## 2022-05-23 DIAGNOSIS — Z131 Encounter for screening for diabetes mellitus: Secondary | ICD-10-CM

## 2022-05-23 DIAGNOSIS — R21 Rash and other nonspecific skin eruption: Secondary | ICD-10-CM

## 2022-05-23 LAB — POCT URINALYSIS DIP (CLINITEK)
Bilirubin, UA: NEGATIVE
Blood, UA: NEGATIVE
Glucose, UA: NEGATIVE mg/dL
Ketones, POC UA: NEGATIVE mg/dL
Leukocytes, UA: NEGATIVE
Nitrite, UA: NEGATIVE
POC PROTEIN,UA: NEGATIVE
Spec Grav, UA: 1.015 (ref 1.010–1.025)
Urobilinogen, UA: 0.2 E.U./dL
pH, UA: 7 (ref 5.0–8.0)

## 2022-05-23 MED ORDER — HYDROCORTISONE 1 % EX OINT: 1.0000 | TOPICAL_OINTMENT | Freq: Two times a day (BID) | CUTANEOUS | 1 refills | Status: AC

## 2022-05-23 MED ORDER — EPINEPHRINE 0.3 MG/0.3ML IJ SOAJ: 0.3000 mg | INTRAMUSCULAR | 1 refills | Status: AC | PRN

## 2022-05-23 NOTE — Progress Notes (Signed)
.  Pt presents for annual physical exam,  -pt would like an allergy testing due to since last week been experiencing generalized body itching and burning sensation in skin

## 2022-05-23 NOTE — Patient Instructions (Signed)

## 2022-05-24 ENCOUNTER — Other Ambulatory Visit: Payer: Self-pay | Admitting: Family

## 2022-05-24 DIAGNOSIS — B9689 Other specified bacterial agents as the cause of diseases classified elsewhere: Secondary | ICD-10-CM | POA: Insufficient documentation

## 2022-05-24 DIAGNOSIS — N760 Acute vaginitis: Secondary | ICD-10-CM | POA: Insufficient documentation

## 2022-05-24 LAB — CERVICOVAGINAL ANCILLARY ONLY
Bacterial Vaginitis (gardnerella): POSITIVE — AB
Candida Glabrata: NEGATIVE
Candida Vaginitis: NEGATIVE
Chlamydia: NEGATIVE
Comment: NEGATIVE
Comment: NEGATIVE
Comment: NEGATIVE
Comment: NEGATIVE
Comment: NEGATIVE
Comment: NORMAL
Neisseria Gonorrhea: NEGATIVE
Trichomonas: NEGATIVE

## 2022-05-24 MED ORDER — METRONIDAZOLE 500 MG PO TABS: 500.0000 mg | ORAL_TABLET | Freq: Two times a day (BID) | ORAL | 0 refills | Status: AC

## 2022-05-25 LAB — LIPID PANEL
Chol/HDL Ratio: 4 ratio (ref 0.0–4.4)
Cholesterol, Total: 247 mg/dL — ABNORMAL HIGH (ref 100–199)
HDL: 61 mg/dL (ref >39)
LDL Chol Calc (NIH): 165 mg/dL — ABNORMAL HIGH (ref 0–99)
Triglycerides: 119 mg/dL (ref 0–149)
VLDL Cholesterol Cal: 21 mg/dL (ref 5–40)

## 2022-05-25 LAB — CMP14+EGFR
ALT: 23 IU/L (ref 0–32)
AST: 33 IU/L (ref 0–40)
Albumin/Globulin Ratio: 1.3 (ref 1.2–2.2)
Albumin: 4.3 g/dL (ref 4.0–5.0)
Alkaline Phosphatase: 62 IU/L (ref 44–121)
BUN/Creatinine Ratio: 12 (ref 9–23)
BUN: 11 mg/dL (ref 6–20)
Bilirubin Total: 0.3 mg/dL (ref 0.0–1.2)
CO2: 21 mmol/L (ref 20–29)
Calcium: 9.6 mg/dL (ref 8.7–10.2)
Chloride: 103 mmol/L (ref 96–106)
Creatinine, Ser: 0.9 mg/dL (ref 0.57–1.00)
Globulin, Total: 3.2 g/dL (ref 1.5–4.5)
Glucose: 83 mg/dL (ref 70–99)
Potassium: 4.5 mmol/L (ref 3.5–5.2)
Sodium: 142 mmol/L (ref 134–144)
Total Protein: 7.5 g/dL (ref 6.0–8.5)
eGFR: 88 mL/min/{1.73_m2} (ref >59)

## 2022-05-25 LAB — CBC
Hematocrit: 41 % (ref 34.0–46.6)
Hemoglobin: 13.3 g/dL (ref 11.1–15.9)
MCH: 26.3 pg — ABNORMAL LOW (ref 26.6–33.0)
MCHC: 32.4 g/dL (ref 31.5–35.7)
MCV: 81 fL (ref 79–97)
Platelets: 294 10*3/uL (ref 150–450)
RBC: 5.05 x10E6/uL (ref 3.77–5.28)
RDW: 13.6 % (ref 11.7–15.4)
WBC: 6.4 10*3/uL (ref 3.4–10.8)

## 2022-05-25 LAB — ALLERGEN, KIDNEY BEAN, RF287: Kidney Bean IgE: <0.1 kU/L

## 2022-05-25 LAB — TSH: TSH: 1.66 u[IU]/mL (ref 0.450–4.500)

## 2022-05-25 LAB — F082-IGE CHEESE, MOLD TYPE: F082-IgE Cheese, Mold Type: <0.1 kU/L

## 2022-05-25 LAB — HEMOGLOBIN A1C
Est. average glucose Bld gHb Est-mCnc: 126 mg/dL
Hgb A1c MFr Bld: 6 % — ABNORMAL HIGH (ref 4.8–5.6)

## 2022-05-25 LAB — ALLERGEN, TOMATO F25: Allergen Tomato, IgE: <0.1 kU/L

## 2022-05-25 LAB — ALLERGEN, CHEESE, CHEDDAR,F81: F081-IgE Cheese, Cheddar Type: <0.1 kU/L

## 2022-05-29 ENCOUNTER — Encounter: Payer: Self-pay | Admitting: Family

## 2022-05-29 DIAGNOSIS — R7303 Prediabetes: Secondary | ICD-10-CM | POA: Insufficient documentation

## 2022-08-13 ENCOUNTER — Ambulatory Visit: Payer: Self-pay | Admitting: *Deleted

## 2022-08-13 ENCOUNTER — Ambulatory Visit: Payer: Federal, State, Local not specified - PPO

## 2022-08-13 VITALS — BP 113/79 | HR 66 | Ht 62.0 in | Wt 195.0 lb

## 2022-08-13 DIAGNOSIS — R1084 Generalized abdominal pain: Secondary | ICD-10-CM | POA: Diagnosis not present

## 2022-08-13 LAB — POCT URINALYSIS DIP (CLINITEK)
Bilirubin, UA: NEGATIVE
Blood, UA: NEGATIVE
Glucose, UA: NEGATIVE mg/dL
Ketones, POC UA: NEGATIVE mg/dL
Leukocytes, UA: NEGATIVE
Nitrite, UA: NEGATIVE
POC PROTEIN,UA: NEGATIVE
Spec Grav, UA: 1.02 (ref 1.010–1.025)
Urobilinogen, UA: 0.2 E.U./dL
pH, UA: 7 (ref 5.0–8.0)

## 2022-08-13 NOTE — Telephone Encounter (Signed)
Summary: possible UTI vaginal pain and recuring urination.   Pt stated experiencing possible UTI vaginal pain and recuring urination. Denied abdominal pain.  No appointments available.  Pt seeking clinical advice.           Chief Complaint: urinary frequency, UTI sx Symptoms: vaginal pain, frequency urination, bright green discharge noted and now yellow. No odor reported.  Frequency: 1 week  Pertinent Negatives: Patient denies fever no blood in urine. Not pregnant just stopped menstrual cycle last week  Disposition: [] ED /[] Urgent Care (no appt availability in office) / [] Appointment(In office/virtual)/ []  [facility]/ [] Home Care/ [] Refused Recommended Disposition /[x] [facility]/ []  Follow-up with PCP Additional Notes:   Recommended mobile bus due to no appt today.       Reason for Disposition  Unusual vaginal discharge (e.g., bad smelling, yellow, green, or foamy-white)  Answer Assessment - Initial Assessment Questions 1. SEVERITY: "How bad is the pain?"  (e.g., Scale 1-10; mild, moderate, or severe)   - MILD (1-3): complains slightly about urination hurting   - MODERATE (4-7): interferes with normal activities     - SEVERE (8-10): excruciating, unwilling or unable to urinate because of the pain      Reports frequency and vaginal pain  2. FREQUENCY: "How many times have you had painful urination today?"      na 3. PATTERN: "Is pain present every time you urinate or just sometimes?"      na 4. ONSET: "When did the painful urination start?"      1 week ago  5. FEVER: "Do you have a fever?" If Yes, ask: "What is your temperature, how was it measured, and when did it start?"     no 6. PAST UTI: "Have you had a urine infection before?" If Yes, ask: "When was the last time?" and "What happened that time?"      na 7. CAUSE: "What do you think is causing the painful urination?"  (e.g., UTI, scratch, Herpes sore)     UTI 8. OTHER SYMPTOMS: "Do you have  any other symptoms?" (e.g., blood in urine, flank pain, genital sores, urgency, vaginal discharge)     Urinary frequency vaginal pain, reports seeing green discharge and now noted to be yellow.  9. PREGNANCY: "Is there any chance you are pregnant?" "When was your last menstrual period?"     Na  Protocols used: Urination Pain - Female-A-AH

## 2022-08-13 NOTE — Progress Notes (Signed)
Patient presents to unit today for UTI symptoms, a Urinalysis dipstick, and urine culture was performed during visit. Results will be sent to provider for review, and treatment options.

## 2022-08-14 ENCOUNTER — Encounter: Payer: Self-pay | Admitting: Physician Assistant

## 2022-08-14 LAB — URINE CULTURE

## 2022-09-05 DIAGNOSIS — H9203 Otalgia, bilateral: Secondary | ICD-10-CM | POA: Diagnosis not present

## 2022-09-05 DIAGNOSIS — Z9109 Other allergy status, other than to drugs and biological substances: Secondary | ICD-10-CM | POA: Diagnosis not present

## 2022-09-05 DIAGNOSIS — R0982 Postnasal drip: Secondary | ICD-10-CM | POA: Diagnosis not present

## 2022-09-05 DIAGNOSIS — R059 Cough, unspecified: Secondary | ICD-10-CM | POA: Diagnosis not present

## 2022-09-05 DIAGNOSIS — R070 Pain in throat: Secondary | ICD-10-CM | POA: Diagnosis not present

## 2023-05-24 ENCOUNTER — Encounter: Payer: Federal, State, Local not specified - PPO | Admitting: Family

## 2023-08-13 ENCOUNTER — Ambulatory Visit (INDEPENDENT_AMBULATORY_CARE_PROVIDER_SITE_OTHER): Payer: Federal, State, Local not specified - PPO | Admitting: Family

## 2023-08-13 VITALS — BP 131/79 | HR 65 | Temp 97.9°F | Resp 16 | Ht 62.5 in | Wt 192.8 lb

## 2023-08-13 DIAGNOSIS — Z0000 Encounter for general adult medical examination without abnormal findings: Secondary | ICD-10-CM | POA: Diagnosis not present

## 2023-08-13 DIAGNOSIS — L309 Dermatitis, unspecified: Secondary | ICD-10-CM | POA: Diagnosis not present

## 2023-08-13 DIAGNOSIS — Z13220 Encounter for screening for lipoid disorders: Secondary | ICD-10-CM

## 2023-08-13 DIAGNOSIS — Z131 Encounter for screening for diabetes mellitus: Secondary | ICD-10-CM

## 2023-08-13 DIAGNOSIS — Z1329 Encounter for screening for other suspected endocrine disorder: Secondary | ICD-10-CM

## 2023-08-13 DIAGNOSIS — Z130 Encounter for screening for diseases of the blood and blood-forming organs and certain disorders involving the immune mechanism: Secondary | ICD-10-CM

## 2023-08-13 DIAGNOSIS — Z23 Encounter for immunization: Secondary | ICD-10-CM | POA: Diagnosis not present

## 2023-08-13 DIAGNOSIS — Z13228 Encounter for screening for other metabolic disorders: Secondary | ICD-10-CM | POA: Diagnosis not present

## 2023-08-13 NOTE — Progress Notes (Unsigned)
No Concerns

## 2023-08-13 NOTE — Progress Notes (Unsigned)
 Patient ID: Hannah Andrews, female    DOB: [birth year]  MRN: [id number]  CC: Annual Exam  Subjective: Hannah Andrews is a 33 y.o. female who presents for annual exam.   Her concerns today include:  - States eczema of buttocks. Denies red flag symptoms. Reports using over-the-counter regimen to help.  Patient Active Problem List   Diagnosis Date Noted   Prediabetes 05/29/2022   BV (bacterial vaginosis) 05/24/2022   Hives 05/14/2022   Rash 03/01/2021     Current Outpatient Medications on File Prior to Visit  Medication Sig Dispense Refill   cetirizine (ZYRTEC) 10 MG tablet Take 1 tablet (10 mg total) by mouth daily. 30 tablet 11   fluticasone (FLONASE) 50 MCG/ACT nasal spray Place 2 sprays into both nostrils daily.     hydrocortisone 1 % ointment Apply 1 Application topically 2 (two) times daily. 60 g 1   Multiple Vitamins-Minerals (MULTI ADULT GUMMIES PO) Take by mouth.     EPINEPHrine (EPIPEN 2-PAK) 0.3 mg/0.3 mL IJ SOAJ injection Inject 0.3 mg into the muscle as needed for anaphylaxis. (Patient not taking: Reported on 08/13/2023) 2 each 1   triamcinolone cream (KENALOG) 0.1 % Apply 1 Application topically 2 (two) times daily. Use for eczema flareup for no more than 7 days at a time. (Patient not taking: Reported on 08/13/2023) 30 g 4   No current facility-administered medications on file prior to visit.    Allergies  Allergen Reactions   Motrin [Ibuprofen] Swelling    Liquid--make eyes swell.      Social History   Socioeconomic History   Marital status: Single    Spouse name: Not on file   Number of children: Not on file   Years of education: Not on file   Highest education level: Some college, no degree  Occupational History   Not on file  Tobacco Use   Smoking status: Never    Passive exposure: Never   Smokeless tobacco: Never  Vaping Use   Vaping status: Never Used  Substance and Sexual Activity   Alcohol use: No   Drug use: No   Sexual activity: Yes     Birth control/protection: Condom  Other Topics Concern   Not on file  Social History Narrative   Not on file   Social Drivers of Health   Financial Resource Strain: Low Risk  (08/13/2023)   Overall Financial Resource Strain (CARDIA)    Difficulty of Paying Living Expenses: Not very hard  Food Insecurity: No Food Insecurity (08/13/2023)   Hunger Vital Sign    Worried About Running Out of Food in the Last Year: Never true    Ran Out of Food in the Last Year: Never true  Transportation Needs: No Transportation Needs (08/13/2023)   PRAPARE - Administrator, Civil Service (Medical): No    Lack of Transportation (Non-Medical): No  Physical Activity: Sufficiently Active (08/13/2023)   Exercise Vital Sign    Days of Exercise per Week: 5 days    Minutes of Exercise per Session: 60 min  Stress: No Stress Concern Present (08/13/2023)   [employer] of Occupational Health - Occupational Stress Questionnaire    Feeling of Stress : Not at all  Social Connections: Moderately Integrated (08/13/2023)   Social Connection and Isolation Panel [NHANES]    Frequency of Communication with Friends and Family: More than three times a week    Frequency of Social Gatherings with Friends and Family: Once a week    Attends  Religious Services: More than 4 times per year    Active Member of Clubs or Organizations: Yes    Attends Club or Organization Meetings: 1 to 4 times per year    Marital Status: Never married  Intimate Partner Violence: Not At Risk (08/13/2023)   Humiliation, Afraid, Rape, and Kick questionnaire    Fear of Current or Ex-Partner: No    Emotionally Abused: No    Physically Abused: No    Sexually Abused: No    Family History  Problem Relation Age of Onset   Diabetes Maternal Grandmother    Diabetes Maternal Grandfather     Past Surgical History:  Procedure Laterality Date   APPENDECTOMY     WISDOM TOOTH EXTRACTION      ROS: Review of Systems Negative except as stated  above  PHYSICAL EXAM: BP 131/79   Pulse 65   Temp 97.9 F (36.6 C) (Oral)   Resp 16   Ht 5' 2.5" (1.588 m)   Wt 192 lb 12.8 oz (87.5 kg)   LMP 08/05/2023 (Exact Date)   SpO2 99%   BMI 34.70 kg/m   Physical Exam HENT:     Head: Normocephalic and atraumatic.     Right Ear: Tympanic membrane, ear canal and external ear normal.     Left Ear: Tympanic membrane, ear canal and external ear normal.     Nose: Nose normal.     Mouth/Throat:     Mouth: Mucous membranes are moist.     Pharynx: Oropharynx is clear.  Eyes:     Extraocular Movements: Extraocular movements intact.     Conjunctiva/sclera: Conjunctivae normal.     Pupils: Pupils are equal, round, and reactive to light.  Neck:     Thyroid: No thyroid mass, thyromegaly or thyroid tenderness.  Cardiovascular:     Rate and Rhythm: Normal rate and regular rhythm.     Pulses: Normal pulses.     Heart sounds: Normal heart sounds.  Pulmonary:     Effort: Pulmonary effort is normal.     Breath sounds: Normal breath sounds.  Chest:     Comments: Patient declined. Abdominal:     General: Bowel sounds are normal.     Palpations: Abdomen is soft.  Genitourinary:    Comments: Patient declined. Musculoskeletal:        General: Normal range of motion.     Right shoulder: Normal.     Left shoulder: Normal.     Right upper arm: Normal.     Left upper arm: Normal.     Right elbow: Normal.     Left elbow: Normal.     Right forearm: Normal.     Left forearm: Normal.     Right wrist: Normal.     Left wrist: Normal.     Right hand: Normal.     Left hand: Normal.     Cervical back: Normal, normal range of motion and neck supple.     Thoracic back: Normal.     Lumbar back: Normal.     Right hip: Normal.     Left hip: Normal.     Right upper leg: Normal.     Left upper leg: Normal.     Right knee: Normal.     Left knee: Normal.     Right lower leg: Normal.     Left lower leg: Normal.     Right ankle: Normal.     Left ankle:  Normal.     Right foot:  Normal.     Left foot: Normal.  Skin:    General: Skin is warm and dry.     Capillary Refill: Capillary refill takes less than 2 seconds.  Neurological:     General: No focal deficit present.     Mental Status: She is alert and oriented to person, place, and time.  Psychiatric:        Mood and Affect: Mood normal.        Behavior: Behavior normal.     ASSESSMENT AND PLAN: 1. Annual physical exam (Primary) - Counseled on 150 minutes of exercise per week as tolerated, healthy eating (including decreased daily intake of saturated fats, cholesterol, added sugars, sodium), STI prevention, and routine healthcare maintenance.  2. Screening for metabolic disorder - Routine screening.  - CMP14+EGFR  3. Screening for deficiency anemia - Routine screening.  - CBC  4. Diabetes mellitus screening - Routine screening.  - Hemoglobin A1c  5. Screening cholesterol level - Routine screening.  - Lipid panel  6. Thyroid disorder screen - Routine screening.  - TSH  7. Eczema, unspecified type - Patient declined pharmacological therapy.  - Patient declined referral to Dermatology.  - Follow-up with primary provider as scheduled.  8. Immunization due - Administered. - Tdap vaccine greater than or equal to 7yo IM    Patient was given the opportunity to ask questions.  Patient verbalized understanding of the plan and was able to repeat key elements of the plan. Patient was given clear instructions to go to Emergency Department or return to medical center if symptoms don't improve, worsen, or new problems develop.The patient verbalized understanding.   Orders Placed This Encounter  Procedures   Tdap vaccine greater than or equal to 7yo IM   CBC   Lipid panel   CMP14+EGFR   Hemoglobin A1c   TSH    Return in about 1 year (around 08/12/2024) for Physical per patient preference.  Rema Fendt, NP

## 2023-08-14 ENCOUNTER — Encounter: Payer: Self-pay | Admitting: Family

## 2023-08-14 ENCOUNTER — Other Ambulatory Visit: Payer: Self-pay | Admitting: Family

## 2023-08-14 DIAGNOSIS — Z13220 Encounter for screening for lipoid disorders: Secondary | ICD-10-CM

## 2023-08-14 LAB — LIPID PANEL
Chol/HDL Ratio: 3.2 ratio (ref 0.0–4.4)
Cholesterol, Total: 201 mg/dL — ABNORMAL HIGH (ref 100–199)
HDL: 62 mg/dL (ref >39)
LDL Chol Calc (NIH): 123 mg/dL — ABNORMAL HIGH (ref 0–99)
Triglycerides: 89 mg/dL (ref 0–149)
VLDL Cholesterol Cal: 16 mg/dL (ref 5–40)

## 2023-08-14 LAB — CBC
Hematocrit: 39.4 % (ref 34.0–46.6)
Hemoglobin: 13.1 g/dL (ref 11.1–15.9)
MCH: 27.2 pg (ref 26.6–33.0)
MCHC: 33.2 g/dL (ref 31.5–35.7)
MCV: 82 fL (ref 79–97)
Platelets: 294 10*3/uL (ref 150–450)
RBC: 4.81 x10E6/uL (ref 3.77–5.28)
RDW: 12.5 % (ref 11.7–15.4)
WBC: 7.2 10*3/uL (ref 3.4–10.8)

## 2023-08-14 LAB — CMP14+EGFR
ALT: 18 IU/L (ref 0–32)
AST: 26 IU/L (ref 0–40)
Albumin: 4.5 g/dL (ref 3.9–4.9)
Alkaline Phosphatase: 62 IU/L (ref 44–121)
BUN/Creatinine Ratio: 15 (ref 9–23)
BUN: 13 mg/dL (ref 6–20)
Bilirubin Total: 0.3 mg/dL (ref 0.0–1.2)
CO2: 22 mmol/L (ref 20–29)
Calcium: 9.6 mg/dL (ref 8.7–10.2)
Chloride: 101 mmol/L (ref 96–106)
Creatinine, Ser: 0.89 mg/dL (ref 0.57–1.00)
Globulin, Total: 2.9 g/dL (ref 1.5–4.5)
Glucose: 77 mg/dL (ref 70–99)
Potassium: 3.8 mmol/L (ref 3.5–5.2)
Sodium: 137 mmol/L (ref 134–144)
Total Protein: 7.4 g/dL (ref 6.0–8.5)
eGFR: 89 mL/min/{1.73_m2} (ref >59)

## 2023-08-14 LAB — TSH: TSH: 2.31 u[IU]/mL (ref 0.450–4.500)

## 2023-08-14 LAB — HEMOGLOBIN A1C
Est. average glucose Bld gHb Est-mCnc: 114 mg/dL
Hgb A1c MFr Bld: 5.6 % (ref 4.8–5.6)

## 2024-08-12 ENCOUNTER — Encounter: Admitting: Family
